# Patient Record
Sex: Male | Born: 1950 | Race: White | Hispanic: No | Marital: Single | Smoking: Heavy tobacco smoker
Health system: Southern US, Community
[De-identification: ages and names within clinical notes are randomized; demographics above are authoritative.]

## PROBLEM LIST (undated history)

## (undated) ENCOUNTER — Emergency Department (HOSPITAL_COMMUNITY): Admission: EM | Discharge status: Against Medical Advice | Payer: Self-pay

## (undated) DIAGNOSIS — K4090 Unilateral inguinal hernia, without obstruction or gangrene, not specified as recurrent: Secondary | ICD-10-CM

## (undated) DIAGNOSIS — I10 Essential (primary) hypertension: Secondary | ICD-10-CM

## (undated) DIAGNOSIS — I255 Ischemic cardiomyopathy: Secondary | ICD-10-CM

## (undated) DIAGNOSIS — E785 Hyperlipidemia, unspecified: Secondary | ICD-10-CM

## (undated) DIAGNOSIS — R9430 Abnormal result of cardiovascular function study, unspecified: Secondary | ICD-10-CM

## (undated) DIAGNOSIS — I2510 Atherosclerotic heart disease of native coronary artery without angina pectoris: Secondary | ICD-10-CM

## (undated) DIAGNOSIS — Z720 Tobacco use: Secondary | ICD-10-CM

## (undated) HISTORY — DX: Hyperlipidemia, unspecified: E78.5

## (undated) HISTORY — DX: Abnormal result of cardiovascular function study, unspecified: R94.30

## (undated) HISTORY — PX: CORONARY ANGIOPLASTY WITH STENT PLACEMENT: SHX49

## (undated) HISTORY — PX: OTHER SURGICAL HISTORY: SHX169

## (undated) HISTORY — DX: Atherosclerotic heart disease of native coronary artery without angina pectoris: I25.10

## (undated) HISTORY — DX: Ischemic cardiomyopathy: I25.5

---

## 1999-07-10 ENCOUNTER — Emergency Department (HOSPITAL_COMMUNITY): Admission: EM | Admit: 1999-07-10 | Discharge: 1999-07-10 | Payer: Self-pay | Admitting: Emergency Medicine

## 1999-07-10 ENCOUNTER — Encounter: Payer: Self-pay | Admitting: Emergency Medicine

## 2012-11-24 ENCOUNTER — Other Ambulatory Visit: Payer: Self-pay

## 2012-11-24 ENCOUNTER — Inpatient Hospital Stay (HOSPITAL_COMMUNITY)
Admission: EM | Admit: 2012-11-24 | Discharge: 2012-11-27 | DRG: 247 | Disposition: A | Discharge status: Home / Self Care | Payer: Medicaid Other | Source: Ambulatory Visit | Attending: Cardiovascular Disease | Admitting: Cardiovascular Disease

## 2012-11-24 ENCOUNTER — Encounter (HOSPITAL_COMMUNITY): Payer: Self-pay | Admitting: Nurse Practitioner

## 2012-11-24 ENCOUNTER — Encounter (HOSPITAL_COMMUNITY)
Admission: EM | Disposition: A | Discharge status: Home / Self Care | Payer: Self-pay | Source: Ambulatory Visit | Attending: Cardiovascular Disease

## 2012-11-24 DIAGNOSIS — I519 Heart disease, unspecified: Secondary | ICD-10-CM | POA: Diagnosis present

## 2012-11-24 DIAGNOSIS — I517 Cardiomegaly: Secondary | ICD-10-CM

## 2012-11-24 DIAGNOSIS — I2109 ST elevation (STEMI) myocardial infarction involving other coronary artery of anterior wall: Secondary | ICD-10-CM

## 2012-11-24 DIAGNOSIS — I255 Ischemic cardiomyopathy: Secondary | ICD-10-CM

## 2012-11-24 DIAGNOSIS — T783XXA Angioneurotic edema, initial encounter: Secondary | ICD-10-CM | POA: Diagnosis not present

## 2012-11-24 DIAGNOSIS — F17200 Nicotine dependence, unspecified, uncomplicated: Secondary | ICD-10-CM | POA: Diagnosis present

## 2012-11-24 DIAGNOSIS — I2510 Atherosclerotic heart disease of native coronary artery without angina pectoris: Secondary | ICD-10-CM | POA: Diagnosis present

## 2012-11-24 DIAGNOSIS — I2589 Other forms of chronic ischemic heart disease: Secondary | ICD-10-CM | POA: Diagnosis present

## 2012-11-24 DIAGNOSIS — I10 Essential (primary) hypertension: Secondary | ICD-10-CM | POA: Diagnosis present

## 2012-11-24 DIAGNOSIS — Z720 Tobacco use: Secondary | ICD-10-CM

## 2012-11-24 HISTORY — PX: PERCUTANEOUS CORONARY STENT INTERVENTION (PCI-S): SHX5485

## 2012-11-24 HISTORY — DX: Essential (primary) hypertension: I10

## 2012-11-24 HISTORY — DX: Tobacco use: Z72.0

## 2012-11-24 HISTORY — PX: LEFT HEART CATHETERIZATION WITH CORONARY ANGIOGRAM: SHX5451

## 2012-11-24 HISTORY — DX: Unilateral inguinal hernia, without obstruction or gangrene, not specified as recurrent: K40.90

## 2012-11-24 LAB — CBC WITH DIFFERENTIAL/PLATELET
Basophils Absolute: 0 10*3/uL (ref 0.0–0.1)
HCT: 42.4 % (ref 39.0–52.0)
Lymphocytes Relative: 17 % (ref 12–46)
Monocytes Absolute: 1 10*3/uL (ref 0.1–1.0)
Neutro Abs: 8.8 10*3/uL — ABNORMAL HIGH (ref 1.7–7.7)
Neutrophils Relative %: 75 % (ref 43–77)
RDW: 12.1 % (ref 11.5–15.5)
WBC: 11.9 10*3/uL — ABNORMAL HIGH (ref 4.0–10.5)

## 2012-11-24 LAB — CBC
MCH: 33.8 pg (ref 26.0–34.0)
MCHC: 36.8 g/dL — ABNORMAL HIGH (ref 30.0–36.0)
MCV: 91.7 fL (ref 78.0–100.0)
Platelets: 189 10*3/uL (ref 150–400)
RBC: 3.97 MIL/uL — ABNORMAL LOW (ref 4.22–5.81)
RDW: 12 % (ref 11.5–15.5)

## 2012-11-24 LAB — CK TOTAL AND CKMB (NOT AT ARMC): CK, MB: 7.5 ng/mL (ref 0.3–4.0)

## 2012-11-24 LAB — POCT I-STAT, CHEM 8
BUN: 11 mg/dL (ref 6–23)
Creatinine, Ser: 0.8 mg/dL (ref 0.50–1.35)
Glucose, Bld: 137 mg/dL — ABNORMAL HIGH (ref 70–99)
Hemoglobin: 13.6 g/dL (ref 13.0–17.0)
Potassium: 3.5 mEq/L (ref 3.5–5.1)
TCO2: 25 mmol/L (ref 0–100)

## 2012-11-24 LAB — LIPID PANEL
Cholesterol: 179 mg/dL (ref 0–200)
Total CHOL/HDL Ratio: 4.5 RATIO
Triglycerides: 117 mg/dL (ref <150)
VLDL: 23 mg/dL (ref 0–40)

## 2012-11-24 LAB — COMPREHENSIVE METABOLIC PANEL
ALT: 20 U/L (ref 0–53)
ALT: 26 U/L (ref 0–53)
AST: 27 U/L (ref 0–37)
AST: 32 U/L (ref 0–37)
Albumin: 3.5 g/dL (ref 3.5–5.2)
Alkaline Phosphatase: 104 U/L (ref 39–117)
BUN: 13 mg/dL (ref 6–23)
CO2: 22 mEq/L (ref 19–32)
Calcium: 8.2 mg/dL — ABNORMAL LOW (ref 8.4–10.5)
Chloride: 96 mEq/L (ref 96–112)
Creatinine, Ser: 0.75 mg/dL (ref 0.50–1.35)
GFR calc non Af Amer: >90 mL/min (ref >90)
Potassium: 4.5 mEq/L (ref 3.5–5.1)
Sodium: 128 mEq/L — ABNORMAL LOW (ref 135–145)
Sodium: 134 mEq/L — ABNORMAL LOW (ref 135–145)
Total Bilirubin: 0.7 mg/dL (ref 0.3–1.2)
Total Protein: 6.2 g/dL (ref 6.0–8.3)

## 2012-11-24 LAB — TSH: TSH: 1.376 u[IU]/mL (ref 0.350–4.500)

## 2012-11-24 LAB — TROPONIN I: Troponin I: 4.44 ng/mL (ref <0.30)

## 2012-11-24 LAB — MRSA PCR SCREENING: MRSA by PCR: POSITIVE — AB

## 2012-11-24 SURGERY — LEFT HEART CATHETERIZATION WITH CORONARY ANGIOGRAM
Anesthesia: LOCAL

## 2012-11-24 MED ORDER — MUPIROCIN 2 % EX OINT
1.0000 "application " | TOPICAL_OINTMENT | Freq: Two times a day (BID) | CUTANEOUS | Status: DC
  Administered 2012-11-24 – 2012-11-26 (×6): 1 via NASAL
  Filled 2012-11-24: qty 22

## 2012-11-24 MED ORDER — PERFLUTREN LIPID MICROSPHERE
1.0000 mL | INTRAVENOUS | Status: AC | PRN
  Filled 2012-11-24: qty 10

## 2012-11-24 MED ORDER — ACETAMINOPHEN 325 MG PO TABS: 650.0000 mg | ORAL_TABLET | ORAL | Status: DC | PRN

## 2012-11-24 MED ORDER — ASPIRIN EC 81 MG PO TBEC
81.0000 mg | DELAYED_RELEASE_TABLET | Freq: Every day | ORAL | Status: DC
  Administered 2012-11-25 – 2012-11-27 (×3): 81 mg via ORAL
  Filled 2012-11-24 (×3): qty 1

## 2012-11-24 MED ORDER — ATORVASTATIN CALCIUM 80 MG PO TABS
80.0000 mg | ORAL_TABLET | Freq: Every day | ORAL | Status: DC
  Administered 2012-11-24 – 2012-11-25 (×2): 80 mg via ORAL
  Filled 2012-11-24 (×3): qty 1

## 2012-11-24 MED ORDER — NITROGLYCERIN 0.4 MG SL SUBL: 0.4000 mg | SUBLINGUAL_TABLET | SUBLINGUAL | Status: DC | PRN

## 2012-11-24 MED ORDER — FUROSEMIDE 10 MG/ML IJ SOLN
INTRAMUSCULAR | Status: AC
  Filled 2012-11-24: qty 4

## 2012-11-24 MED ORDER — GUAIFENESIN 100 MG/5ML PO SOLN
10.0000 mL | ORAL | Status: DC | PRN
  Administered 2012-11-24 – 2012-11-25 (×2): 200 mg via ORAL
  Filled 2012-11-24 (×2): qty 10

## 2012-11-24 MED ORDER — SODIUM CHLORIDE 0.9 % IV SOLN
INTRAVENOUS | Status: AC
  Administered 2012-11-24 (×2): 75 mL/h via INTRAVENOUS

## 2012-11-24 MED ORDER — BIVALIRUDIN 250 MG IV SOLR
INTRAVENOUS | Status: AC
  Filled 2012-11-24: qty 250

## 2012-11-24 MED ORDER — SODIUM CHLORIDE 0.9 % IJ SOLN
3.0000 mL | Freq: Two times a day (BID) | INTRAMUSCULAR | Status: DC
  Administered 2012-11-24 – 2012-11-26 (×5): 3 mL via INTRAVENOUS

## 2012-11-24 MED ORDER — ZOLPIDEM TARTRATE 5 MG PO TABS: 5.0000 mg | ORAL_TABLET | Freq: Every evening | ORAL | Status: DC | PRN

## 2012-11-24 MED ORDER — METOPROLOL TARTRATE 25 MG PO TABS
25.0000 mg | ORAL_TABLET | Freq: Two times a day (BID) | ORAL | Status: DC
  Administered 2012-11-24 (×2): 25 mg via ORAL
  Filled 2012-11-24 (×4): qty 1

## 2012-11-24 MED ORDER — PRASUGREL HCL 10 MG PO TABS
10.0000 mg | ORAL_TABLET | Freq: Every day | ORAL | Status: DC
  Administered 2012-11-25 – 2012-11-27 (×3): 10 mg via ORAL
  Filled 2012-11-24 (×3): qty 1

## 2012-11-24 MED ORDER — SODIUM CHLORIDE 0.9 % IJ SOLN: 3.0000 mL | INTRAMUSCULAR | Status: DC | PRN

## 2012-11-24 MED ORDER — SODIUM CHLORIDE 0.9 % IV SOLN: 250.0000 mL | INTRAVENOUS | Status: DC | PRN

## 2012-11-24 MED ORDER — SODIUM CHLORIDE 0.9 % IV SOLN
0.2500 mg/kg/h | INTRAVENOUS | Status: DC
  Filled 2012-11-24: qty 250

## 2012-11-24 MED ORDER — GUAIFENESIN 100 MG/5ML PO SYRP
200.0000 mg | ORAL_SOLUTION | ORAL | Status: DC | PRN
  Filled 2012-11-24: qty 118

## 2012-11-24 MED ORDER — FENTANYL CITRATE 0.05 MG/ML IJ SOLN
INTRAMUSCULAR | Status: AC
  Filled 2012-11-24: qty 2

## 2012-11-24 MED ORDER — CHLORHEXIDINE GLUCONATE CLOTH 2 % EX PADS
6.0000 | MEDICATED_PAD | Freq: Every day | CUTANEOUS | Status: DC
  Administered 2012-11-25 – 2012-11-26 (×2): 6 via TOPICAL

## 2012-11-24 MED ORDER — ONDANSETRON HCL 4 MG/2ML IJ SOLN: 4.0000 mg | Freq: Four times a day (QID) | INTRAMUSCULAR | Status: DC | PRN

## 2012-11-24 MED ORDER — PERFLUTREN LIPID MICROSPHERE
INTRAVENOUS | Status: AC
  Administered 2012-11-24: 14:00:00
  Filled 2012-11-24: qty 10

## 2012-11-24 MED ORDER — PRASUGREL HCL 10 MG PO TABS
ORAL_TABLET | ORAL | Status: AC
  Filled 2012-11-24: qty 6

## 2012-11-24 MED ORDER — ALPRAZOLAM 0.25 MG PO TABS: 0.2500 mg | ORAL_TABLET | Freq: Two times a day (BID) | ORAL | Status: DC | PRN

## 2012-11-24 MED ORDER — MIDAZOLAM HCL 2 MG/2ML IJ SOLN
INTRAMUSCULAR | Status: AC
  Filled 2012-11-24: qty 2

## 2012-11-24 MED ORDER — METOPROLOL TARTRATE 1 MG/ML IV SOLN
INTRAVENOUS | Status: AC
  Filled 2012-11-24: qty 5

## 2012-11-24 NOTE — CV Procedure (Signed)
Cardiac Catheterization Operative Report  Kyle Finley [id number] 5/21/201410:40 AM No primary provider on file.  Procedure Performed:  1. Left Heart Catheterization 2. Selective Coronary Angiography 3. Left ventricular angiogram 4. PTCA/DES x 1 proximal LAD 5. Angioseal RFA  Operator: Verne Carrow, MD  Indication:  62 yo with history of tobacco abuse, HTN presented via EMS with anterior STEMI.       Non-system delay in intervention due to inability to pass a wire down the LAD.                              Procedure Details: Emergency consent obtained in the chart.  The patient concurred with the proposed plan, giving informed verbal consent. The patient was brought to the cath lab via EMS. The patient was sedated with Versed and Fentanyl. The right groin was prepped and draped in the usual manner. Using the modified Seldinger access technique, a 6 French sheath was placed in the right femoral artery. Standard diagnostic catheters were used to perform selective coronary angiography. A JR 4 catheter was used to engage the RCA. A XB LAD 3.5 guiding catheter was used to engage the left main artery.  The patient was found to have severe stenosis in the ostium of the LAD extending into the mid vessel with TIMI-2 flow down into the distal LAD.   PCI Note: The patient was given a bolus of Angiomax and a drip was started. He was given Effient 60 mg po x 1. I had considerable difficulty engaging a wire into the ostium of the LAD. Multiple wires were used. I eventually was able to engage a Fielder XT wire into the ostium of the LAD and the wire was advanced into the distal LAD. A 2.5 x 15 mm balloon was used to pre-dilate the stenosis back to the ostium. I then carefully positioned a 2.75 x 24 mm Promus Premier DES in the proximal LAD extending back to the ostium. None of the angiographic views showed the true ostium of the vessel. The stent was positioned back as far as I felt reasonable  without compromising the ostium of the Circumflex. The stent was deployed without difficulty. I then post-dilated the stent with a 3.0 x 15 mm [place] balloon x 2. There appeared to be an excellent angiographic resullt. There was good flow into the distal vessel.   A pigtail catheter was used to perform a left ventricular angiogram. There were no immediate complications. The patient was taken to the recovery area in stable condition.   Hemodynamic Findings: Central aortic pressure: 142/101 Left ventricular pressure: 136/20/21  Angiographic Findings:  Left main:  Distal 30% stenosis.   Left Anterior Descending Artery: 99% stenosis extending from the ostium into the mid vessel just before a septal perforator. The mid and distal vessel has diffuse 30% stenosis. The diagonal branch is moderate caliber with mild plaque.   Circumflex Artery: Large caliber vessel with 20% proximal stenosis. The first OM branch is small in caliber with mild plaque. The second OM branch is very small in caliber with 80% ostial stenosis. The third OM branch has mild plaque. The distal AV groove Circumflex has a 40-50% stenosis.   Right Coronary Artery: Large caliber, dominant vessel with 20% mid stenosis.   Left Ventricular Angiogram: LVEF 30% with severe hypokinesis of the anterior wall, apex and inferoapical walls.   Impression: 1. 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 Anterior  STEMI 3. Successful PTCA/DES x 1 proximal LAD 4. Moderate to severe LV systolic dysfunction  Recommendations: He will need dual anti-platelet therapy with ASA/Effient for at least one year. Will start statin, beta blocker. If BP tolerates, start Ace-inh in am. Echo in am. To CCU. Tobacco cessation.        Complications:  None. The patient tolerated the procedure well.

## 2012-11-24 NOTE — Care Management Note (Signed)
    Page 1 of 1   11/24/2012     2:21:30 PM   CARE MANAGEMENT NOTE 11/24/2012  Patient:  ZIDAN, HELGET   Account Number:  [id number]  Date Initiated:  11/24/2012  Documentation initiated by:  Junius Creamer  Subjective/Objective Assessment:   adm w mi     Action/Plan:   lives w fam   Anticipated DC Date:     Anticipated DC Plan:        DC Planning Services  CM consult  Medication Assistance      Choice offered to / List presented to:             Status of service:   Medicare Important Message given?   (If response is "NO", the following Medicare IM given date fields will be blank) Date Medicare IM given:   Date Additional Medicare IM given:    Discharge Disposition:    Per UR Regulation:  Reviewed for med. necessity/level of care/duration of stay  If discussed at Long Length of Stay Meetings, dates discussed:    Comments:  5/21 1418 debbie Levorn Oleski rn,bsn gave pt effient 30day free and copay assist card. no ins for meds. left effient pt assist form on chart for md to sign.

## 2012-11-24 NOTE — Progress Notes (Signed)
Echocardiogram 2D Echocardiogram with Definity has been performed.  Kyle Finley 11/24/2012, 2:48 PM

## 2012-11-24 NOTE — Progress Notes (Signed)
Chaplain Note:  Chaplain responded to Code STEMI page received at 08:48.  When chaplain arrived, pt was in cath lab 6 being treated by cath lab staff.  No family was present.  Chaplain checked for family's arrival throughout the procedure but at the end of the procedure, no one had yet arrived.  Chaplain provided spiritual comfort and support for cath lab staff who expressed appreciation for chaplain support.  Chaplain will follow up as needed.  11/24/12 1100  Clinical Encounter Type  Visited With Patient;Health care provider  Visit Type Spiritual support  Referral From Other (Comment) (Code Stemi page)  Spiritual Encounters  Spiritual Needs Emotional  Stress Factors  Patient Stress Factors Health changes  Family Stress Factors None identified (No family present)  Verdie Shire, chaplain [phone]

## 2012-11-24 NOTE — H&P (Signed)
Patient ID: Kyle Finley MRN: [id number], DOB/AGE: Feb 23, 1951   Admit date: 11/24/2012  Primary Physician: None Primary Cardiologist: New - seen by C. Roddy Bellamy, MD (pt lives in [place])  Pt. Profile:  62 y/o male without prior cardiac hx who presents with acute anterior STEMI.  Problem List  Past Medical History  Diagnosis Date  . HTN (hypertension)   . Tobacco abuse     a. 1.5 ppd x 40 yrs  . Inguinal hernia     a. s/p repair x 2 on left    Past Surgical History  Procedure Laterality Date  . Hernia repair      a. L inguinal x 2     Allergies  No Known Allergies  HPI  62 y/o male w/o prior cardiac hx.  He has a h/o untreated HTN, reportedly off of meds x at least 5 years.  He has also been smoking 1.5 ppd of cigarettes since his 20's.  His father and 2 brothers died with MI's in their 28's  He was in his usoh until 6 days ago when he began to experience substernal chest discomfort, usually associated with eating breakfast and drinking coffee, while at rest, sometimes associated with diaphoresis, usually lasting about 15 mins and resolving spontaneously.  He has had multiple episodes over the past 6 days but never with exertion.  This AM, while smoking a cigarette and drinking coffee, at approximately 8:30 AM, he developed recurrent chest pain associated with diaphoresis.  This was worse than previous episodes and he called 911.  Upon arrival, he was noted to have anterior ST elevation and he was treated with ASA and ntg with complete relief of Ss and some resolution of ST elevation.  Code STEMI was activated and he was taken to the [facility] cath lab for further evaluation.  Upon arrival, he is pain free but with persistent anterior ST elevation.  Home Medications  Prior to Admission medications   None   Family History  Family History  Problem Relation Age of Onset  . Diabetes Mother     alive @ 47  . Heart attack Father     died @ 35  . Heart attack Brother     died  @ 46  . Heart attack Brother     died @ 90  . Cancer Sister     died @ 74   Social History  History   Social History  . Marital Status: Legally Separated    Spouse Name: N/A    Number of Children: N/A  . Years of Education: N/A   Occupational History  . Not on file.   Social History Main Topics  . Smoking status: Heavy Tobacco Smoker -- 1.50 packs/day for 40 years  . Smokeless tobacco: Not on file  . Alcohol Use: No  . Drug Use: No  . Sexually Active: Not on file   Other Topics Concern  . Not on file   Social History Narrative   Lives in [place] with his mother.  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    Review of Systems General:  No chills, fever, night sweats or weight changes.  Cardiovascular:  +++ chest pain and diaphoresis.  No dyspnea on exertion, edema, orthopnea, palpitations, paroxysmal nocturnal dyspnea. Dermatological: No rash, lesions/masses Respiratory: No cough, dyspnea Urologic: No hematuria, dysuria Abdominal:   No nausea, vomiting, diarrhea, bright red blood per rectum, melena, or hematemesis Neurologic:  No  visual changes, wkns, changes in mental status. All other systems reviewed and are otherwise negative except as noted above.  Physical Exam  There were no vitals taken for this visit.  General: Pleasant, NAD Psych: Normal affect. Neuro: Alert and oriented X 3. Moves all extremities spontaneously. HEENT: Normal  Neck: Supple without bruits or JVD. Lungs:  Resp regular and unlabored, diminished breath sounds bilat. Heart: RRR no s3, s4, or murmurs. Abdomen: Soft, non-tender, non-distended, BS + x 4.  Extremities: No clubbing, cyanosis or edema. DP/PT/Radials 2+ and equal bilaterally.  Labs  All labs pending  Radiology/Studies  No results found.  ECG  ST, 122, ivcd, 2-3 mm ST elevation in I, V1-V4, twi aVL.  ASSESSMENT AND PLAN  1.  Acute anterior STEMI:  Intermittent pain since last week.   Worse this AM.  Currently pain free after receiving ntg from EMS.  Persistent anterior ST elevation.  Emergent cath ongoing.  Plan asa, high potency statin, bb (as pressure allows), and P2Y12 inhibitor provided he does not have surgical dzs.    2.  HTN:  Add bb +/- acei as tolerated.  3.  Lipids:  Unknown.  Check lipids/lft's.  Adding high potency statin.  4.  Tob Abuse:  Will require cessation counseling.   Signed, Nicolasa Ducking, NP 11/24/2012, 9:22 AM  I have personally seen and examined this patient with Ward Givens, NP. I agree with the assessment and plan as outlined above. Anterior STEMI. Emergent cath with possible PCI.   Kyle Finley 12:30 PM 11/24/2012

## 2012-11-24 NOTE — Progress Notes (Addendum)
Dr Clifton James aware CE will be positive, no need to call unless pt symptomatic, per conversation

## 2012-11-25 DIAGNOSIS — F17200 Nicotine dependence, unspecified, uncomplicated: Secondary | ICD-10-CM

## 2012-11-25 DIAGNOSIS — I2109 ST elevation (STEMI) myocardial infarction involving other coronary artery of anterior wall: Secondary | ICD-10-CM

## 2012-11-25 DIAGNOSIS — Z720 Tobacco use: Secondary | ICD-10-CM | POA: Diagnosis present

## 2012-11-25 DIAGNOSIS — I10 Essential (primary) hypertension: Secondary | ICD-10-CM

## 2012-11-25 LAB — CBC
HCT: 39.1 % (ref 39.0–52.0)
Hemoglobin: 14.1 g/dL (ref 13.0–17.0)
MCH: 33 pg (ref 26.0–34.0)
MCHC: 36.1 g/dL — ABNORMAL HIGH (ref 30.0–36.0)
MCV: 91.6 fL (ref 78.0–100.0)
RBC: 4.27 MIL/uL (ref 4.22–5.81)

## 2012-11-25 LAB — BASIC METABOLIC PANEL
BUN: 18 mg/dL (ref 6–23)
CO2: 25 mEq/L (ref 19–32)
Calcium: 9 mg/dL (ref 8.4–10.5)
GFR calc non Af Amer: >90 mL/min (ref >90)
Glucose, Bld: 116 mg/dL — ABNORMAL HIGH (ref 70–99)

## 2012-11-25 LAB — LIPID PANEL: Cholesterol: 198 mg/dL (ref 0–200)

## 2012-11-25 LAB — TROPONIN I
Troponin I: 4.91 ng/mL (ref <0.30)
Troponin I: 4.89 ng/mL (ref <0.30)
Troponin I: 3.98 ng/mL (ref <0.30)

## 2012-11-25 LAB — HEMOGLOBIN A1C: Mean Plasma Glucose: 105 mg/dL (ref <117)

## 2012-11-25 MED ORDER — METOPROLOL TARTRATE 50 MG PO TABS
50.0000 mg | ORAL_TABLET | Freq: Two times a day (BID) | ORAL | Status: DC
  Administered 2012-11-25 – 2012-11-27 (×5): 50 mg via ORAL
  Filled 2012-11-25 (×6): qty 1

## 2012-11-25 MED ORDER — MENTHOL 3 MG MT LOZG
1.0000 | LOZENGE | OROMUCOSAL | Status: DC | PRN
  Administered 2012-11-25 (×2): 3 mg via ORAL
  Filled 2012-11-25: qty 9

## 2012-11-25 MED ORDER — CEPASTAT 14.5 MG MT LOZG
1.0000 | LOZENGE | OROMUCOSAL | Status: DC | PRN
  Filled 2012-11-25: qty 18

## 2012-11-25 MED FILL — Sodium Chloride IV Soln 0.9%: INTRAVENOUS | Qty: 50 | Status: AC

## 2012-11-25 MED FILL — Lidocaine HCl Local Preservative Free (PF) Inj 1%: INTRAMUSCULAR | Qty: 30 | Status: AC

## 2012-11-25 MED FILL — Heparin Sodium (Porcine) 2 Unit/ML in Sodium Chloride 0.9%: INTRAMUSCULAR | Qty: 1000 | Status: AC

## 2012-11-25 NOTE — Progress Notes (Signed)
CARDIAC REHAB PHASE I   PRE:  Rate/Rhythm: 93 SR    BP: sitting 104/70    SaO2:   MODE:  Ambulation: 500 ft   POST:  Rate/Rhythm: 102    BP: sitting 120/80     SaO2:   Tolerated well, no c/o. Began ed. Pt sts he doesn't like medicine. Encouraged pt to think about his goals in life. Planning to attempt to quit smoking. Will continue ed tomorrow. [phone]   Elissa Lovett [place] CES, ACSM 11/25/2012 3:25 PM

## 2012-11-25 NOTE — Progress Notes (Signed)
Subjective:  The patient was admitted yesterday with acute anterior STEMI, s/p DES to LAD.  Overnight, the patient notes no chest pain, SOB, LH, dizziness, nausea, or diaphoresis.  No bleeding from cath site.  Troponins peaked at 7.94, and have now downtrended to 4.91.  Objective:  Vital Signs in the last 24 hours: Temp:  [98 F (36.7 C)-99.5 F (37.5 C)] 98 F (36.7 C) (05/22 0400) Pulse Rate:  [92-109] 95 (05/22 0630) Resp:  [16-28] 21 (05/22 0630) BP: (87-151)/(58-117) 108/74 mmHg (05/22 0630) SpO2:  [89 %-99 %] 93 % (05/22 0630) Weight:  [169 lb 1.5 oz (76.7 kg)-174 lb 2.6 oz (79 kg)] 169 lb 1.5 oz (76.7 kg) (05/22 0500)  Intake/Output from previous day: 05/21 0701 - 05/22 0700 In: 975 [P.O.:600; I.V.:375] Out: 2300 [Urine:2300]  Physical Exam: General: alert, cooperative, and in no apparent distress HEENT: pupils equal round and reactive to light, vision grossly intact, oropharynx clear and non-erythematous  Neck: supple, no lymphadenopathy Lungs: clear to ascultation bilaterally, normal work of respiration, no wheezes, rales, ronchi Heart: regular rate and rhythm, no murmurs, gallops, or rubs Abdomen: soft, non-tender, non-distended, normal bowel sounds.  Right groin bandaged, without oozing. Extremities: no cyanosis, clubbing, or edema Neurologic: alert & oriented X3, cranial nerves II-XII intact, strength grossly intact, sensation intact to light touch  Lab Results:  Recent Labs  11/24/12 1125 11/25/12 0510  WBC 11.9* PENDING  HGB 15.3 14.1  PLT 225 PENDING    Recent Labs  11/24/12 1125 11/25/12 0510  NA 134* 136  K 4.5 4.0  CL 96 98  CO2 25 25  GLUCOSE 126* 116*  BUN 13 18  CREATININE 0.85 0.85    Recent Labs  11/24/12 2237 11/25/12 0510  TROPONINI 7.55* 4.91*    Cardiac Studies: Cardiac Cath 11/24/12 Central aortic pressure: 142/101  Left ventricular pressure: 136/20/21  Angiographic Findings:  Left main: Distal 30% stenosis.  Left  Anterior Descending Artery: 99% stenosis extending from the ostium into the mid vessel just before a septal perforator. The mid and distal vessel has diffuse 30% stenosis. The diagonal branch is moderate caliber with mild plaque.  Circumflex Artery: Large caliber vessel with 20% proximal stenosis. The first OM branch is small in caliber with mild plaque. The second OM branch is very small in caliber with 80% ostial stenosis. The third OM branch has mild plaque. The distal AV groove Circumflex has a 40-50% stenosis.  Right Coronary Artery: Large caliber, dominant vessel with 20% mid stenosis.  Left Ventricular Angiogram: LVEF 30% with severe hypokinesis of the anterior wall, apex and inferoapical walls.  Impression:  1. 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 Anterior STEMI  3. Successful PTCA/DES x 1 proximal LAD  4. Moderate to severe LV systolic dysfunction  Recommendations: He will need dual anti-platelet therapy with ASA/Effient for at least one year. Will start statin, beta blocker. If BP tolerates, start Ace-inh in am. Echo in am. To CCU. Tobacco cessation.   Echocardiogram 11/24/12 - Left ventricle: Akinesis entire apex, base/mid anterior, base/mid anteroseptal, mid inferoseptal, mid inferior. Contrast was used. There is no definite clot. The cavity size was normal. Wall thickness was increased in a pattern of moderate LVH. The estimated ejection fraction was 20%. Findings consistent with left ventricular diastolic dysfunction. - Right ventricle: Systolic function was mildly reduced.  Tele: NSR  Assessment/Plan:  The patient is a 62 yo man, history of tobacco abuse, HTN, FH of MI, presenting with chest  pain, found to have occlusion of LAD, s/p DES x1.  # STEMI - the patient presented with anterior STEMI, found to have occlusion of LAD, s/p DES x1.  The patient notes no chest pain overnight. -continue aspirin, effient -continue atorvastatin -increase metoprolol to 50  mg BID  # HTN - off medications x5 years.  BP's now mostly in 110/70's. -continue metoprolol -consider ACE-I as BP tolerates, consider starting tomorrow  # Reduced EF - Echo shows an EF of 20%, with significant areas of akinesis.  Hopefully this just represents stunning of the myocardium after STEMI.  Alternatively, this may represent CHF, due to ischemic cardiomyopathy. -continue metoprolol -ACE-I once BP allows -repeat echo in the outpatient setting  # Tobacco abuse -encouraged cessation  # Dispo - recovering well, transfer to telemetry  Janalyn Harder, M.D. 11/25/2012, 6:52 AM  Attending Note:   The patient was seen and examined.  Agree with assessment and plan as noted above.  Changes made to the above note as needed.  Mr. Shanholtzer is doing well.  Will transfer him to tele.  Continue other meds.  He needs to stop smoking.   Aggressive lipid lowering therapy.   Vesta Mixer, Montez Hageman., MD, [facility] 11/25/2012, 9:10 AM

## 2012-11-26 DIAGNOSIS — I2589 Other forms of chronic ischemic heart disease: Secondary | ICD-10-CM

## 2012-11-26 DIAGNOSIS — T783XXA Angioneurotic edema, initial encounter: Secondary | ICD-10-CM

## 2012-11-26 LAB — COMPREHENSIVE METABOLIC PANEL
Alkaline Phosphatase: 102 U/L (ref 39–117)
BUN: 16 mg/dL (ref 6–23)
CO2: 25 mEq/L (ref 19–32)
Chloride: 99 mEq/L (ref 96–112)
GFR calc Af Amer: >90 mL/min (ref >90)
GFR calc non Af Amer: >90 mL/min (ref >90)
Glucose, Bld: 121 mg/dL — ABNORMAL HIGH (ref 70–99)
Potassium: 3.7 mEq/L (ref 3.5–5.1)
Total Bilirubin: 0.6 mg/dL (ref 0.3–1.2)

## 2012-11-26 LAB — CBC
HCT: 40.7 % (ref 39.0–52.0)
Hemoglobin: 14.1 g/dL (ref 13.0–17.0)
MCV: 93.1 fL (ref 78.0–100.0)
WBC: 9.5 10*3/uL (ref 4.0–10.5)

## 2012-11-26 LAB — TROPONIN I: Troponin I: 4.39 ng/mL (ref <0.30)

## 2012-11-26 LAB — C-REACTIVE PROTEIN: CRP: 5.6 mg/dL — ABNORMAL HIGH (ref <0.60)

## 2012-11-26 MED ORDER — EPINEPHRINE HCL 0.1 MG/ML IJ SOSY
0.5000 mg | PREFILLED_SYRINGE | Freq: Once | INTRAMUSCULAR | Status: AC
  Administered 2012-11-26: 0.5 mg via INTRAVENOUS
  Filled 2012-11-26: qty 10

## 2012-11-26 NOTE — Progress Notes (Signed)
MEDICATION RELATED CONSULT NOTE - INITIAL   Pharmacy Consult for medications that can cause angioedema   No Known Allergies  Vital Signs: Temp: 98.8 F (37.1 C) (05/23 0436) Temp src: Oral (05/23 0436) BP: 124/84 mmHg (05/23 0436) Pulse Rate: 89 (05/23 0436)  Medical History: Past Medical History  Diagnosis Date  . HTN (hypertension)   . Tobacco abuse     a. 1.5 ppd x 40 yrs  . Inguinal hernia     a. s/p repair x 2 on left    Medications:  Scheduled:  . aspirin EC  81 mg Oral Daily  . Chlorhexidine Gluconate Cloth  6 each Topical Q0600  . EPINEPHrine  0.5 mg Intravenous Once  . metoprolol tartrate  50 mg Oral BID  . mupirocin ointment  1 application Nasal BID  . prasugrel  10 mg Oral Daily  . sodium chloride  3 mL Intravenous Q12H    Assessment: 62 yo man with angioedema reported this am.  Medications were reviewed and agree with MD's assessment that statin most likely cause.  Please advise if we can be of further assistance.   Thanks for allowing pharmacy to be a part of this patient's care.  Talbert Cage, PharmD Clinical Pharmacist, [phone]

## 2012-11-26 NOTE — Progress Notes (Signed)
Pt's bottom lip noted swollen this morning, MD on-call notified. He came in and assessed the Pt and no new order received. No distress indicated. Denies any discomfort at this time. Will cont to monitor.

## 2012-11-26 NOTE — Progress Notes (Signed)
Epinephrine 0.5mg  given for Swelling of lower lip per Md ordered. Currently resting in bed with eyes closed. No indications of distress noted. Will continue to monitor.

## 2012-11-26 NOTE — Progress Notes (Signed)
    SUBJECTIVE: No chest pain or SOB. Lip swelling is better. Tongue feels normal.   BP 124/84  Pulse 89  Temp(Src) 98.8 F (37.1 C) (Oral)  Resp 18  Wt 170 lb 3.1 oz (77.2 kg)  SpO2 94%  Intake/Output Summary (Last 24 hours) at 11/26/12 0703 Last data filed at 11/25/12 2300  Gross per 24 hour  Intake    903 ml  Output    400 ml  Net    503 ml    PHYSICAL EXAM General: Well developed, well nourished, in no acute distress. Alert and oriented x 3.  Psych:  Good affect, responds appropriately Neck: No JVD. No masses noted.  Lungs: Poor air movement bilaterally with no wheezes or rhonci noted.  Heart: RRR with no murmurs noted. Abdomen: Bowel sounds are present. Soft, non-tender.  Extremities: No lower extremity edema.   LABS: Basic Metabolic Panel:  Recent Labs  16/10/96 0510 11/26/12 0500  NA 136 136  K 4.0 3.7  CL 98 99  CO2 25 25  GLUCOSE 116* 121*  BUN 18 16  CREATININE 0.85 0.84  CALCIUM 9.0 9.1   CBC:  Recent Labs  11/24/12 1010 11/24/12 1125 11/25/12 0510 11/26/12 0500  WBC 11.1* 11.9* 11.2* 9.5  NEUTROABS  --  8.8*  --   --   HGB 13.4 15.3 14.1 14.1  HCT 36.4* 42.4 39.1 40.7  MCV 91.7 92.4 91.6 93.1  PLT 189 225 PLATELET CLUMPS NOTED ON SMEAR, COUNT APPEARS ADEQUATE 229   Cardiac Enzymes:  Recent Labs  11/24/12 1010  11/25/12 1230 11/25/12 1658 11/25/12 2329  CKTOTAL 218  --   --   --   --   CKMB 7.5*  --   --   --   --   TROPONINI 3.50*  < > 4.89* 3.98* 4.39*  < > = values in this interval not displayed. Fasting Lipid Panel:  Recent Labs  11/25/12 0510  CHOL 198  HDL 41  LDLCALC 128*  TRIG 144  CHOLHDL 4.8    Current Meds: . aspirin EC  81 mg Oral Daily  . Chlorhexidine Gluconate Cloth  6 each Topical Q0600  . metoprolol tartrate  50 mg Oral BID  . mupirocin ointment  1 application Nasal BID  . prasugrel  10 mg Oral Daily  . sodium chloride  3 mL Intravenous Q12H     ASSESSMENT AND PLAN: 62 yo male with history of  tobacco abuse, HTN admitted with anterior STEMI on 11/24/12. Now s/p DES x 1 proximal LAD.   1. CAD/STEMI:  the patient presented with anterior STEMI, found to have occlusion of LAD, s/p DES x1. The patient notes no chest pain overnight. Will continue aspirin, effient for one year. He was on a statin but stopped last night with lower lip swelling concering for allergic reaction. Continue metoprolol 50 mg BID. Will not start Ace-inh currently in setting of possible angioedema.   2. HTN: Stable on current meds.   3. Ischemic cardiomyopathy:  Echo shows an EF of 20%, with significant areas of akinesis. Continue beta blocker. No Ace-inh initiation in setting of angioedema. Repeat echo in the outpatient setting   4. Tobacco abuse: Cessation encouraged.    5. Angioedema: Lower lip involvement this AM. Given epinephrine. Statin held. He has tolerated ASA in the past. Will follow closely. No airway involvement.     Quantina Dershem  5/23/20147:03 AM

## 2012-11-26 NOTE — Progress Notes (Signed)
CARDIAC REHAB PHASE I   PRE:  Rate/Rhythm: 87 SR   MODE:  Ambulation: 500 ft   POST:  Rate/Rhythm: 99 SR   Pt walking independently. Offered to go on another walk with me. Feels good.  Ed completed. Asked appropriate questions. Going to try smoking cessation.  Not open to NRT. Pt sts he is not sure he can quit but will try. [facility] closing in June and other programs are too far to drive. Will ex on his own. [phone]  Harriet Masson CES, ACSM 11/26/2012 11:55 AM

## 2012-11-26 NOTE — Progress Notes (Addendum)
Cross-Cover Called by nursing due to lip swelling.   Pt with lower lip angio-edema. No urticaria or rashes. Tongue is not involved. No throat swelling or difficulty breathing. Onset around 4 am. Had some grapefruit juice at 10 pm. Meds include aspirin (has taken before without problem), atorvastatin, metoprolol, prasugrel; all given yesterday 8+ hrs ago.  Web search indicates that of these medications, aspirin or the statin is perhaps the most likely culprits. Pt reports taking aspirin in the past without difficultly. Discontinued atorvastatin.  Pharmacy consult entered to look for other potential medications.  Self limited to lip, no evidence of impending respiratory compromise; continue close monitoring. 0.5 mg epinephrine to bedside as a precaution.  Addendum: In reviewing pt's course via remote Epic, noted that bedside epinephrine was given IV. First, this was not the order as it should have been given IM. Second, it was only if needed ("to bedside") in the setting of anaphylactic emergency and the MD was to be contacted prior to administration.

## 2012-11-27 ENCOUNTER — Encounter (HOSPITAL_COMMUNITY): Payer: Self-pay

## 2012-11-27 MED ORDER — NITROGLYCERIN 0.4 MG SL SUBL: 0.4000 mg | SUBLINGUAL_TABLET | SUBLINGUAL | Status: DC | PRN

## 2012-11-27 MED ORDER — METOPROLOL TARTRATE 50 MG PO TABS: 50.0000 mg | ORAL_TABLET | Freq: Two times a day (BID) | ORAL | Status: DC

## 2012-11-27 MED ORDER — LISINOPRIL 2.5 MG PO TABS: 2.5000 mg | ORAL_TABLET | Freq: Two times a day (BID) | ORAL | Status: DC

## 2012-11-27 MED ORDER — PRASUGREL HCL 10 MG PO TABS: 10.0000 mg | ORAL_TABLET | Freq: Every day | ORAL | Status: DC

## 2012-11-27 MED ORDER — LISINOPRIL 2.5 MG PO TABS
2.5000 mg | ORAL_TABLET | Freq: Two times a day (BID) | ORAL | Status: DC
  Filled 2012-11-27 (×2): qty 1

## 2012-11-27 MED ORDER — ASPIRIN 81 MG PO TBEC: 81.0000 mg | DELAYED_RELEASE_TABLET | Freq: Every day | ORAL | Status: AC

## 2012-11-27 NOTE — Progress Notes (Addendum)
Kyle Finley  62 y.o.  male  Subjective: Feels fine; walked in the hall without difficulty. Has not experienced dyspnea. 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  Allergy: Lipitor  Objective: Vital signs in last 24 hours: Temp:  [98.3 F (36.8 C)-98.9 F (37.2 C)] 98.3 F (36.8 C) (05/24 0559) Pulse Rate:  [80-90] 80 (05/24 0559) Resp:  [18] 18 (05/24 0559) BP: (104-121)/(69-80) 121/76 mmHg (05/24 0559) SpO2:  [94 %-98 %] 98 % (05/24 0559) Weight:  [76.5 kg (168 lb 10.4 oz)-76.567 kg (168 lb 12.8 oz)] 76.5 kg (168 lb 10.4 oz) (05/24 0812)  76.5 kg (168 lb 10.4 oz) There is no height on file to calculate BMI.  Weight change: -0.633 kg (-1 lb 6.3 oz) Last BM Date: 11/26/12  Intake/Output from previous day: 05/23 0701 - 05/24 0700 In: 480 [P.O.:480] Out: -  Total I/O since admission:  -200 cc  General- Well developed; no acute distress; hoarse voice Neck- No JVD Lungs- clear lung fields; some prolongation of the expiratory phase Cardiovascular- normal PMI; normal S1 and S2; no third heart sound Abdomen- normal bowel sounds; soft and non-tender without masses or organomegaly Skin- Warm, no significant lesions Extremities- Nl distal pulses; no edema  Lab Results: Cardiac Markers:   Recent Labs  11/25/12 1658 11/25/12 2329  TROPONINI 3.98* 4.39*   CBC:   Recent Labs  11/25/12 0510 11/26/12 0500  WBC 11.2* 9.5  HGB 14.1 14.1  HCT 39.1 40.7  PLT PLATELET CLUMPS NOTED ON SMEAR, COUNT APPEARS ADEQUATE 229   BMET:  Recent Labs  11/25/12 0510 11/26/12 0500  NA 136 136  K 4.0 3.7  CL 98 99  CO2 25 25  GLUCOSE 116* 121*  BUN 18 16  CREATININE 0.85 0.84  CALCIUM 9.0 9.1   Hepatic Function:   Recent Labs  11/26/12 0500  PROT 7.0  ALBUMIN 3.1*  AST 20  ALT 23  ALKPHOS 102  BILITOT 0.6   GFR:  CrCl is unknown because there is no height on file for the current visit. Lipids:  Lipid Panel     Component Value  Date/Time   CHOL 198 11/25/2012 0510   TRIG 144 11/25/2012 0510   HDL 41 11/25/2012 0510   CHOLHDL 4.8 11/25/2012 0510   VLDL 29 11/25/2012 0510   LDLCALC 128* 11/25/2012 0510   EKG: 11/26/12  Normal sinus rhythm;  anterior myocardial infarction in evolution; preserved R-wave voltage in leads V5-V6.  Imaging Studies/Results: No results found.  Medications:  I have reviewed the patient's current medications. Scheduled: . aspirin EC  81 mg Oral Daily  . Chlorhexidine Gluconate Cloth  6 each Topical Q0600  . metoprolol tartrate  50 mg Oral BID  . mupirocin ointment  1 application Nasal BID  . prasugrel  10 mg Oral Daily  . sodium chloride  3 mL Intravenous Q12H    Assessment/Plan: Acute myocardial infarction: Uncomplicated course to date. Severe left ventricular dysfunction at presentation raises concern about the possibility of developing congestive heart failure, arrhythmias or left ventricular thrombus. Repeat echocardiogram will be obtained in approximately 2 weeks to assess for possible recovery of myocardial contractility. Medications will require dose adjustment after discharge. Early appointment will be needed in 1-2 weeks at either the [place] office or in [place].  Importance of uninterrupted administration of Prasugrel stressed.  Referral to cardiac rehabilitation discussed-patient does not wish to commit at this time.  Treatment with eplerenone or  spironolactone can be considered, but will be superfluous if LV function recovers. Similarly, the patient is currently a candidate for AICD implantation, but hopefully will not be if you EF improves.  Hypertension: Normal blood pressures in hospital. Cardiac medications will likely adequately controlled what appears to be mildly elevated blood pressure. Low-dose ACE inhibitor will be added to beta blocker.  Tobacco abuse: Importance of discontinuing cigarette smoking explained, but patient does not appear to be motivated at present.   LOS: 3  days   [place] Bing 11/27/2012, 11:04 AM 20 minutes spent with patient

## 2012-11-27 NOTE — Discharge Summary (Signed)
Physician Discharge Summary  Patient ID: Kyle Finley MRN: [id number] DOB/AGE: 64/25/1952 64 y.o.  Admit date: 11/24/2012 Discharge date: 11/27/2012  Primary Discharge Diagnosis:  1.Acute Myocardial Infarction of the proximal LAD 2. Systolic Dysfunction  Secondary Discharge Diagnosis: 1. Hypertension 2, Tobacco Abuse  Significant Diagnostic Studies:  1. Cardiac Catheterization 11/24/2012  Angiographic Findings:  Left main: Distal 30% stenosis.  Left Anterior Descending Artery: 99% stenosis extending from the ostium into the mid vessel just before a septal perforator. The mid and distal vessel has diffuse 30% stenosis. The diagonal branch is moderate caliber with mild plaque.  Circumflex Artery: Large caliber vessel with 20% proximal stenosis. The first OM branch is small in caliber with mild plaque. The second OM branch is very small in caliber with 80% ostial stenosis. The third OM branch has mild plaque. The distal AV groove Circumflex has a 40-50% stenosis.  Right Coronary Artery: Large caliber, dominant vessel with 20% mid stenosis.  Left Ventricular Angiogram: LVEF 30% with severe hypokinesis of the anterior wall, apex and inferoapical walls.  Impression:  1. 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 Anterior STEMI  3. Successful PTCA/DES x 1 proximal LAD  4. Moderate to severe LV systolic dysfunction  Hospital Course: Kyle Finley is a 62 year old male patient admitted in the setting of ST elevation MI on 5 21 2014. The patient had sudden onset of chest pain with associated diaphoresis while smoking a cigarette and drinking coffee earlier that morning. The patient was brought emergently to cardiac catheterization lab where Dr. Clifton James performed coronary angiography. This demonstrated severe stenosis at the ostium of the LAD of 99% extending into the mid vessel. The patient was treated with a Promus Premer DES. The patient was also noted to have a very small second OM  branch with 80% ostial stenosis. LVEF per cardiac catheterization was 30% with severe hypokinesis of the anterior wall, apex and inferior apical walls.  The patient was placed on dual antiplatelet therapy with aspirin and Effient. Statin,ACE inhibitor and beta blocker were also initiated. He was given tobacco cessation instructions. He worked with cardiac rehabilitation during hospitalization as well. No recurrence of chest discomfort. He had no evidence of bleeding.  On day of discharge, he was seen and examined by Dr. Cot[place] Bing. It is recommended the patient have a two-week followup, with echocardiogram completed. He will be followed closely, and monitored for response to medication therapy. Consideration for ICD pacemaker should LV function not recover. The patient wishes to followup in the [place] office, as that is where he lives. Appointment will be made for that office with Dr. Deshler Lions.  Discharge Exam: Blood pressure 121/76, pulse 80, temperature 98.3 F (36.8 C), temperature source Oral, resp. rate 18, weight 168 lb 10.4 oz (76.5 kg), SpO2 98.00%. Labs:   Lab Results  Component Value Date   WBC 9.5 11/26/2012   HGB 14.1 11/26/2012   HCT 40.7 11/26/2012   MCV 93.1 11/26/2012   PLT 229 11/26/2012    Recent Labs Lab 11/26/12 0500  NA 136  K 3.7  CL 99  CO2 25  BUN 16  CREATININE 0.84  CALCIUM 9.1  PROT 7.0  BILITOT 0.6  ALKPHOS 102  ALT 23  AST 20  GLUCOSE 121*   Lab Results  Component Value Date   CKTOTAL 218 11/24/2012   CKMB 7.5* 11/24/2012   TROPONINI 4.39* 11/25/2012    Lab Results  Component Value Date   CHOL 198 11/25/2012   CHOL  179 11/24/2012   Lab Results  Component Value Date   HDL 41 11/25/2012   HDL 40 1/91/4782   Lab Results  Component Value Date   LDLCALC 128* 11/25/2012   LDLCALC 116* 11/24/2012   Lab Results  Component Value Date   TRIG 144 11/25/2012   TRIG 117 11/24/2012   Lab Results  Component Value Date   CHOLHDL 4.8 11/25/2012    CHOLHDL 4.5 11/24/2012   No results found for this basename: LDLDIRECT    NFA:OZHYQM sinus rhythm rate of 87 bpm Left axis deviation Incomplete right bundle branch block  FOLLOW UP PLANS AND APPOINTMENTS Discharge Orders   Future Orders Complete By Expires     Diet - low sodium heart healthy  As directed     Increase activity slowly  As directed         Medication List    TAKE these medications       aspirin 81 MG EC tablet  Take 1 tablet (81 mg total) by mouth daily.     lisinopril 2.5 MG tablet  Commonly known as:  PRINIVIL,ZESTRIL  Take 1 tablet (2.5 mg total) by mouth 2 (two) times daily.     metoprolol 50 MG tablet  Commonly known as:  LOPRESSOR  Take 1 tablet (50 mg total) by mouth 2 (two) times daily.     nitroGLYCERIN 0.4 MG SL tablet  Commonly known as:  NITROSTAT  Place 1 tablet (0.4 mg total) under the tongue every 5 (five) minutes x 3 doses as needed for chest pain.     prasugrel 10 MG Tabs  Commonly known as:  EFFIENT  Take 1 tablet (10 mg total) by mouth daily.           Follow-up Information   Follow up with Rollene Rotunda, MD. (Our office will call you for appt next week. [place] office preferrred)    Contact information:   [address] [place] [address] [phone]     Time spent with patient to include physician time:40 minutes  Joni Reining 11/27/2012, 3:25 PM  Cardiology Attending Patient interviewed and examined. Discussed with Joni Reining, NP.  Above note annotated and modified based upon my findings.  Patient is doing remarkably well after a large myocardial infarction in the distribution of the left anterior descending coronary artery. He will need close outpatient followup.  [place] Bing, MD 11/27/2012, 4:11 PM

## 2012-11-27 NOTE — Progress Notes (Signed)
CARDIAC REHAB PHASE I   PRE:  Rate/Rhythm: 85 sinus  BP:  Standing: 126/68     MODE:  Ambulation: 890 ft   POST:  Rate/Rhythem: 103 sinus tach  BP:  Sitting: 126/64      Pt ambulated 890 ft with assist x1.  Tolerated well with no complaints.  He had no follow up questions from yesterday's education session.  Fabio Pierce, MA, ACSM RCEP [phone]  Hazle Nordmann

## 2012-11-30 ENCOUNTER — Telehealth: Payer: Self-pay | Admitting: *Deleted

## 2012-11-30 NOTE — Telephone Encounter (Signed)
Patient states that he has one more dose left for his effient in which he will take on tomorrow and can't afford this medication. Nurse advised patient to call office tomorrow and check availability for 30 day free or samples around 1:00 pm. Nurse called drug rep and requested coupons and or samples for our office.

## 2012-12-01 ENCOUNTER — Other Ambulatory Visit: Payer: Self-pay | Admitting: *Deleted

## 2012-12-01 DIAGNOSIS — I10 Essential (primary) hypertension: Secondary | ICD-10-CM

## 2012-12-01 DIAGNOSIS — I2589 Other forms of chronic ischemic heart disease: Secondary | ICD-10-CM

## 2012-12-01 MED ORDER — PRASUGREL HCL 10 MG PO TABS: 10.0000 mg | ORAL_TABLET | Freq: Every day | ORAL | Status: DC

## 2012-12-01 NOTE — Telephone Encounter (Signed)
Patient came to office to pick up samples  

## 2012-12-01 NOTE — Addendum Note (Signed)
Addended by: Eustace Moore on: 12/01/2012 03:46 PM   Modules accepted: Orders

## 2012-12-09 ENCOUNTER — Other Ambulatory Visit (INDEPENDENT_AMBULATORY_CARE_PROVIDER_SITE_OTHER): Payer: Self-pay

## 2012-12-09 ENCOUNTER — Other Ambulatory Visit: Payer: Self-pay

## 2012-12-09 DIAGNOSIS — I2510 Atherosclerotic heart disease of native coronary artery without angina pectoris: Secondary | ICD-10-CM

## 2012-12-09 DIAGNOSIS — I10 Essential (primary) hypertension: Secondary | ICD-10-CM

## 2012-12-09 DIAGNOSIS — I2589 Other forms of chronic ischemic heart disease: Secondary | ICD-10-CM

## 2012-12-16 ENCOUNTER — Encounter: Payer: Self-pay | Admitting: Physician Assistant

## 2012-12-16 ENCOUNTER — Ambulatory Visit (INDEPENDENT_AMBULATORY_CARE_PROVIDER_SITE_OTHER): Payer: Self-pay | Admitting: Physician Assistant

## 2012-12-16 VITALS — BP 180/120 | HR 68 | Ht 67.0 in | Wt 175.0 lb

## 2012-12-16 DIAGNOSIS — I2510 Atherosclerotic heart disease of native coronary artery without angina pectoris: Secondary | ICD-10-CM

## 2012-12-16 DIAGNOSIS — I255 Ischemic cardiomyopathy: Secondary | ICD-10-CM

## 2012-12-16 DIAGNOSIS — I2589 Other forms of chronic ischemic heart disease: Secondary | ICD-10-CM

## 2012-12-16 DIAGNOSIS — Z720 Tobacco use: Secondary | ICD-10-CM

## 2012-12-16 DIAGNOSIS — I429 Cardiomyopathy, unspecified: Secondary | ICD-10-CM

## 2012-12-16 DIAGNOSIS — E785 Hyperlipidemia, unspecified: Secondary | ICD-10-CM | POA: Insufficient documentation

## 2012-12-16 DIAGNOSIS — F17200 Nicotine dependence, unspecified, uncomplicated: Secondary | ICD-10-CM

## 2012-12-16 DIAGNOSIS — I428 Other cardiomyopathies: Secondary | ICD-10-CM

## 2012-12-16 DIAGNOSIS — I10 Essential (primary) hypertension: Secondary | ICD-10-CM | POA: Insufficient documentation

## 2012-12-16 MED ORDER — PANTOPRAZOLE SODIUM 40 MG PO TBEC: 40.0000 mg | DELAYED_RELEASE_TABLET | Freq: Every day | ORAL | Status: DC

## 2012-12-16 MED ORDER — OMEGA-3 FATTY ACIDS 1000 MG PO CAPS: 2.0000 g | ORAL_CAPSULE | Freq: Two times a day (BID) | ORAL | Status: DC

## 2012-12-16 MED ORDER — LISINOPRIL 5 MG PO TABS: 5.0000 mg | ORAL_TABLET | Freq: Two times a day (BID) | ORAL | Status: DC

## 2012-12-16 NOTE — Assessment & Plan Note (Signed)
Patient reports no recurrent symptoms since recent hospitalization. Of note, I reviewed today's markedly abnormal EKG with Dr. Myrtis Ser, who agrees that this represents evolutionary changes. Patient remains asymptomatic. Therefore, continued aggressive medical management and early followup is recommended. Patient to resume routine activities, but is to continue refraining from any strenuous activity for one full month.

## 2012-12-16 NOTE — Assessment & Plan Note (Signed)
Patient has significantly curtailed his tobacco smoking, and appears motivated to stop altogether. He reports having tried nicotine patches in the past, but to no avail.

## 2012-12-16 NOTE — Assessment & Plan Note (Signed)
Recent followup echocardiogram indicated normalization of LVF (55%), compared to EF 30% by emergent cardiac catheterization. Continue therapy with ASA (patient reports today that he has not been taking since discharge), Effient (at least one year), ACE inhibitor, and beta blocker. Of note, patient was not able to tolerate atorvastatin, secondary to angioedema of the lip. Will refer for cardiac rehabilitation and arrange for early followup in our [facility] clinic, at which time he will establish with Dr. Antoine Poche. Patient previously expressed a preference to followup in our [place] clinic, citing convenience.

## 2012-12-16 NOTE — Assessment & Plan Note (Signed)
Repeat BPs, including both arms, indicates uncontrolled HTN (170-180 systolic). Therefore, will increase ACE inhibitor to 5 mg twice a day and arrange for an RN visit in one week for reassessment. Will order followup BMET in one week, for monitoring of potassium and renal function.

## 2012-12-16 NOTE — Patient Instructions (Addendum)
Take Enteric Coated Aspirin 81mg  daily.  You have been referred to Cardiac Rehab at [facility].  You should get established with a Primary Care Physician in [place], [place].  Start Protonix 40mg  daily.  Fasting Labs in 12 weeks:  Lipid/Liver - Lab orders given to patient.  Your physician recommends that you schedule a follow-up appointment in: 2 months with Dr. Antoine Poche.  Your physician recommends that you schedule a follow-up appointment in 1 week with the nurse for a blood pressure check.  Lab work in 1 week:  BMET  Increase Lisinopril to 5mg  twice daily.  Start taking Omega III Fish Oil 2grams twice daily.

## 2012-12-16 NOTE — Assessment & Plan Note (Signed)
Will add omega-3 fish oil 2 g twice a day. Reassess lipid status in 12 weeks.

## 2012-12-16 NOTE — Progress Notes (Signed)
Primary Cardiologist: Rollene Rotunda, MD (new)   HPI: Post hospital followup from [facility] [address], status post presentation with acute anterior STEMI.  Emergent cardiac catheterization yielded severe 1v CAD with 99% ostial LAD stenosis, treated successfully with PTCA/DES. Residual nonobstructive CAD; EF 30%, severe anterior apical/inferior apical HK.   - Echocardiogram, May 21: EF 20%, no definite thrombus   - Echocardiogram, June 5 (outpatient): EF 55%   Patient presented with no known CAD, but with CRFs notable for HTN, tobacco, significant FH, and age.   Of note, prior to discharge patient was diagnosed with possible angioedema of the lower lip, presumed secondary to atorvastatin. He required treatment with epinephrine.  He presents today with no recurrent symptoms associated with his recent MI. He denies any anterior chest discomfort with routine activities or walking. He does complain of significant reflux symptoms, but is currently not on any medication.  He continues to smoke, but has cut back to 1/2 ppd.   Twelve-lead EKG today, reviewed by me, indicates NSR 60 bpm; marked symmetric T wave inversions anterolaterally and inferiorly, but with no Q waves Allergies  Allergen Reactions  . Lipitor (Atorvastatin)     Angioedema    Current Outpatient Prescriptions  Medication Sig Dispense Refill  . aspirin EC 81 MG EC tablet Take 1 tablet (81 mg total) by mouth daily.  30 tablet  10  . lisinopril (PRINIVIL,ZESTRIL) 5 MG tablet Take 1 tablet (5 mg total) by mouth 2 (two) times daily.  180 tablet  3  . metoprolol (LOPRESSOR) 50 MG tablet Take 1 tablet (50 mg total) by mouth 2 (two) times daily.  30 tablet  10  . nitroGLYCERIN (NITROSTAT) 0.4 MG SL tablet Place 1 tablet (0.4 mg total) under the tongue every 5 (five) minutes x 3 doses as needed for chest pain.  30 tablet  12  . prasugrel (EFFIENT) 10 MG TABS Take 1 tablet (10 mg total) by mouth daily.  42 tablet  0  . fish oil-omega-3 fatty acids  1000 MG capsule Take 2 capsules (2 g total) by mouth 2 (two) times daily.  180 capsule  3  . pantoprazole (PROTONIX) 40 MG tablet Take 1 tablet (40 mg total) by mouth daily.  90 tablet  3   No current facility-administered medications for this visit.    Past Medical History  Diagnosis Date  . HTN (hypertension)   . Tobacco abuse     a. 1.5 ppd x 40 yrs  . Inguinal hernia     a. s/p repair x 2 on left  . CAD (coronary artery disease)     Acute anterior STEMI/emergent DES 99% ostial LAD, 11/2012  . Ischemic cardiomyopathy     Initial EF 30%, by cardiac catheterization; followup EF 55%, by echo  . HLD (hyperlipidemia)     Atorvastatin intolerant, secondary to angioedema of the lip    Past Surgical History  Procedure Laterality Date  . Hernia repair      a. L inguinal x 2    History   Social History  . Marital Status: Divorced    Spouse Name: N/A    Number of Children: N/A  . Years of Education: N/A   Occupational History  . Not on file.   Social History Main Topics  . Smoking status: Heavy Tobacco Smoker -- 1.50 packs/day for 40 years  . Smokeless tobacco: Not on file  . Alcohol Use: No  . Drug Use: No  . Sexually Active: Not on file  Other Topics Concern  . Not on file   Social History Narrative   Lives in [place] with his mother.  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    Family History  Problem Relation Age of Onset  . Diabetes Mother     alive @ 37  . Heart attack Father     died @ 13  . Heart attack Brother     died @ 70  . Heart attack Brother     died @ 9  . Cancer Sister     died @ 86    ROS: no nausea, vomiting; no fever, chills; no melena, hematochezia; no claudication  PHYSICAL EXAM: BP 180/120  Pulse 68  Ht 5\' 7"  (1.702 m)  Wt 175 lb (79.379 kg)  BMI 27.4 kg/m2 GENERAL: 62 year-old male; NAD HEENT: NCAT, PERRLA, EOMI; sclera clear; no xanthelasma NECK: palpable bilateral carotid pulses, no  bruits; no JVD; no TM LUNGS: CTA bilaterally CARDIAC: RRR (S1, S2); no significant murmurs; no rubs or gallops ABDOMEN: soft, non-tender; intact BS EXTREMETIES: intact R femoral pulse, no hematoma or bruit; no significant peripheral edema SKIN: warm/dry; no obvious rash/lesions MUSCULOSKELETAL: no joint deformity NEURO: no focal deficit; NL affect   EKG: reviewed and available in Electronic Records   ASSESSMENT & PLAN:  Ischemic cardiomyopathy Recent followup echocardiogram indicated normalization of LVF (55%), compared to EF 30% by emergent cardiac catheterization. Continue therapy with ASA (patient reports today that he has not been taking since discharge), Effient (at least one year), ACE inhibitor, and beta blocker. Of note, patient was not able to tolerate atorvastatin, secondary to angioedema of the lip. Will refer for cardiac rehabilitation and arrange for early followup in our [facility] clinic, at which time he will establish with Dr. Antoine Poche. Patient previously expressed a preference to followup in our [place] clinic, citing convenience.  HLD (hyperlipidemia) Will add omega-3 fish oil 2 g twice a day. Reassess lipid status in 12 weeks.  HTN (hypertension) Repeat BPs, including both arms, indicates uncontrolled HTN (170-180 systolic). Therefore, will increase ACE inhibitor to 5 mg twice a day and arrange for an RN visit in one week for reassessment. Will order followup BMET in one week, for monitoring of potassium and renal function.  CAD (coronary artery disease) Patient reports no recurrent symptoms since recent hospitalization. Of note, I reviewed today's markedly abnormal EKG with Dr. Myrtis Ser, who agrees that this represents evolutionary changes. Patient remains asymptomatic. Therefore, continued aggressive medical management and early followup is recommended. Patient to resume routine activities, but is to continue refraining from any strenuous activity for one full  month.  Tobacco abuse Patient has significantly curtailed his tobacco smoking, and appears motivated to stop altogether. He reports having tried nicotine patches in the past, but to no avail.    Gene Judy Pollman, PAC

## 2012-12-20 ENCOUNTER — Encounter: Payer: Self-pay | Admitting: Cardiology

## 2012-12-20 DIAGNOSIS — IMO0002 Reserved for concepts with insufficient information to code with codable children: Secondary | ICD-10-CM | POA: Insufficient documentation

## 2012-12-20 DIAGNOSIS — R9430 Abnormal result of cardiovascular function study, unspecified: Secondary | ICD-10-CM | POA: Insufficient documentation

## 2012-12-23 ENCOUNTER — Encounter: Payer: Self-pay | Admitting: *Deleted

## 2012-12-23 ENCOUNTER — Ambulatory Visit (INDEPENDENT_AMBULATORY_CARE_PROVIDER_SITE_OTHER): Payer: Medicaid Other | Admitting: *Deleted

## 2012-12-23 VITALS — BP 157/96 | HR 67 | Ht 67.0 in | Wt 174.0 lb

## 2012-12-23 DIAGNOSIS — I10 Essential (primary) hypertension: Secondary | ICD-10-CM

## 2012-12-23 MED ORDER — LISINOPRIL 10 MG PO TABS: 10.0000 mg | ORAL_TABLET | Freq: Two times a day (BID) | ORAL | Status: DC

## 2012-12-23 NOTE — Progress Notes (Signed)
Patient informed and verbalized understanding of plan. 

## 2012-12-23 NOTE — Progress Notes (Signed)
Patient needs to take ASA, or risk having another MI. It is extremely important to take ASA, while also taking Effient. Regarding his BP, I recommend he increase ACE to 10 bid. He also needs to take Omega III fish oil, since he had an allergic reaction to statin therapy, to try to control his lipids. I prescribed Protonix since it is least likely of PPIs to interfere with Effient.

## 2012-12-23 NOTE — Progress Notes (Signed)
Patient presents for nurse visit per last office visit request for BP check. Patient has taken all medications except aspirin, protonix and fish oil. Patient states the aspirin caused him to feel very cold so he stopped it. Patient said he has never started the fish oil or protonix. Patient said protonix was too expensive and he just never picked up fish oil. Patient denies chest pain, dizziness or sob. No side effects to medications he is taking.

## 2012-12-30 ENCOUNTER — Other Ambulatory Visit: Payer: Self-pay | Admitting: *Deleted

## 2012-12-30 MED ORDER — PRASUGREL HCL 10 MG PO TABS: 10.0000 mg | ORAL_TABLET | Freq: Every day | ORAL | Status: DC

## 2012-12-30 NOTE — Telephone Encounter (Signed)
Informed patient to bring proof of insurance so that application for effient assistance can be sent to [employer].

## 2013-01-12 ENCOUNTER — Encounter: Payer: Self-pay | Admitting: *Deleted

## 2013-02-11 ENCOUNTER — Telehealth: Payer: Self-pay | Admitting: *Deleted

## 2013-02-11 NOTE — Telephone Encounter (Signed)
Samples received for Effient 10 mg tablets 4 bottle of 30 tabs each Lot # [id number]  Exp 01/2014 - will forward to [facility] office to get to pt.

## 2013-02-16 ENCOUNTER — Encounter: Payer: Self-pay | Admitting: Cardiology

## 2013-02-16 ENCOUNTER — Ambulatory Visit (INDEPENDENT_AMBULATORY_CARE_PROVIDER_SITE_OTHER): Payer: Self-pay | Admitting: Cardiology

## 2013-02-16 VITALS — BP 137/93 | HR 76 | Ht 67.0 in | Wt 177.0 lb

## 2013-02-16 DIAGNOSIS — I2109 ST elevation (STEMI) myocardial infarction involving other coronary artery of anterior wall: Secondary | ICD-10-CM

## 2013-02-16 DIAGNOSIS — I2510 Atherosclerotic heart disease of native coronary artery without angina pectoris: Secondary | ICD-10-CM

## 2013-02-16 NOTE — Patient Instructions (Addendum)
FASTING LIPID PANEL TO BE DONE 02/18/13 @ 9:30  NO CHANGES WERE MADE TODAY  Your physician wants you to follow-up in: 6 MONTHS WITH DR. HOCHREIN. You will receive a reminder letter in the mail two months in advance. If you don't receive a letter, please call our office to schedule the follow-up appointment.

## 2013-02-16 NOTE — Progress Notes (Signed)
HPI The patient presents for followup of an acute myocardial infarction earlier this year. He had 99% ostial LAD stenosis treated with a drug-eluting stent. Initially her ejection fraction was about 20% but improved to 55% on followup echo in June.  Since he was last seen he has had no further cardiovascular complaints.  The patient denies any new symptoms such as chest discomfort, neck or arm discomfort. There has been no new shortness of breath, PND or orthopnea. There have been no reported palpitations, presyncope or syncope.  He does continue to smoke cigarettes.   Allergies  Allergen Reactions  . Lipitor [Atorvastatin]     Angioedema    Current Outpatient Prescriptions  Medication Sig Dispense Refill  . aspirin EC 81 MG EC tablet Take 1 tablet (81 mg total) by mouth daily.  30 tablet  10  . fish oil-omega-3 fatty acids 1000 MG capsule Take 2 capsules (2 g total) by mouth 2 (two) times daily.  180 capsule  3  . lisinopril (PRINIVIL,ZESTRIL) 10 MG tablet Take 1 tablet (10 mg total) by mouth 2 (two) times daily.  180 tablet  3  . metoprolol (LOPRESSOR) 50 MG tablet Take 1 tablet (50 mg total) by mouth 2 (two) times daily.  30 tablet  10  . nitroGLYCERIN (NITROSTAT) 0.4 MG SL tablet Place 1 tablet (0.4 mg total) under the tongue every 5 (five) minutes x 3 doses as needed for chest pain.  30 tablet  12  . pantoprazole (PROTONIX) 40 MG tablet Take 1 tablet (40 mg total) by mouth daily.  90 tablet  3  . prasugrel (EFFIENT) 10 MG TABS Take 1 tablet (10 mg total) by mouth daily.  56 tablet  0   No current facility-administered medications for this visit.    Past Medical History  Diagnosis Date  . HTN (hypertension)   . Tobacco abuse     a. 1.5 ppd x 40 yrs  . Inguinal hernia     a. s/p repair x 2 on left  . CAD (coronary artery disease)     Acute anterior STEMI/emergent DES 99% ostial LAD, 11/2012  . Ischemic cardiomyopathy     Initial EF 30%, by cardiac catheterization; followup EF  55%, by echo  . HLD (hyperlipidemia)     Atorvastatin intolerant, secondary to angioedema of the lip  . Ejection fraction     EF 30%, at time of acute MI    //   EF 55%, echo, June, 2014, hypokinesis of the distal anteroseptal wall    Past Surgical History  Procedure Laterality Date  . Hernia repair      a. L inguinal x 2    ROS:  As stated in the HPI and negative for all other systems.  PHYSICAL EXAM BP 137/93  Pulse 76  Ht 5\' 7"  (1.702 m)  Wt 177 lb (80.287 kg)  BMI 27.72 kg/m2 GENERAL:  Well appearing HEENT:  Pupils equal round and reactive, fundi not visualized, oral mucosa unremarkable NECK:  No jugular venous distention, waveform within normal limits, carotid upstroke brisk and symmetric, no bruits, no thyromegaly LYMPHATICS:  No cervical, inguinal adenopathy LUNGS:  Clear to auscultation bilaterally BACK:  No CVA tenderness CHEST:  Unremarkable HEART:  PMI not displaced or sustained,S1 and S2 within normal limits, no S3, no S4, no clicks, no rubs, no murmurs ABD:  Flat, positive bowel sounds normal in frequency in pitch, no bruits, no rebound, no guarding, no midline pulsatile mass, no hepatomegaly, no splenomegaly  EXT:  2 plus pulses throughout, no edema, no cyanosis no clubbing SKIN:  No rashes no nodules NEURO:  Cranial nerves II through XII grossly intact, motor grossly intact throughout PSYCH:  Cognitively intact, oriented to person place and time   ASSESSMENT AND PLAN  CAD:  The patient has no new sypmtoms.  No further cardiovascular testing is indicated.  We will continue with aggressive risk reduction and meds as listed.  HTN:  The blood pressure is at target. No change in medications is indicated. We will continue with therapeutic lifestyle changes (TLC).  TOBACCO ABUSE:  We talked about this. He does not want any prescriptions or other over-the-counter medications  DYSLIPIDEMIA:  He did no tolerate statins with angioedema.  I will check a fasting lipid  profile on fish oil.

## 2013-02-18 ENCOUNTER — Ambulatory Visit (INDEPENDENT_AMBULATORY_CARE_PROVIDER_SITE_OTHER): Payer: Self-pay | Admitting: *Deleted

## 2013-02-18 DIAGNOSIS — I2510 Atherosclerotic heart disease of native coronary artery without angina pectoris: Secondary | ICD-10-CM

## 2013-02-18 LAB — LIPID PANEL
HDL: 39.4 mg/dL (ref >39.00)
VLDL: 46 mg/dL — ABNORMAL HIGH (ref 0.0–40.0)

## 2013-02-18 LAB — LDL CHOLESTEROL, DIRECT: Direct LDL: 210.1 mg/dL

## 2013-02-23 NOTE — Progress Notes (Signed)
He has not tolerated statins.  He should go to [facility] in [place].

## 2013-03-11 ENCOUNTER — Other Ambulatory Visit: Payer: Self-pay | Admitting: *Deleted

## 2013-03-11 DIAGNOSIS — E7800 Pure hypercholesterolemia, unspecified: Secondary | ICD-10-CM

## 2013-03-15 ENCOUNTER — Emergency Department (HOSPITAL_COMMUNITY)
Admission: EM | Admit: 2013-03-15 | Discharge: 2013-03-15 | Disposition: A | Discharge status: Home / Self Care | Payer: Self-pay | Attending: Emergency Medicine | Admitting: Emergency Medicine

## 2013-03-15 ENCOUNTER — Encounter (HOSPITAL_COMMUNITY): Payer: Self-pay | Admitting: Emergency Medicine

## 2013-03-15 DIAGNOSIS — F17200 Nicotine dependence, unspecified, uncomplicated: Secondary | ICD-10-CM | POA: Insufficient documentation

## 2013-03-15 DIAGNOSIS — Z7982 Long term (current) use of aspirin: Secondary | ICD-10-CM | POA: Insufficient documentation

## 2013-03-15 DIAGNOSIS — E785 Hyperlipidemia, unspecified: Secondary | ICD-10-CM | POA: Insufficient documentation

## 2013-03-15 DIAGNOSIS — Z8679 Personal history of other diseases of the circulatory system: Secondary | ICD-10-CM | POA: Insufficient documentation

## 2013-03-15 DIAGNOSIS — Z8719 Personal history of other diseases of the digestive system: Secondary | ICD-10-CM | POA: Insufficient documentation

## 2013-03-15 DIAGNOSIS — T4995XA Adverse effect of unspecified topical agent, initial encounter: Secondary | ICD-10-CM | POA: Insufficient documentation

## 2013-03-15 DIAGNOSIS — T783XXA Angioneurotic edema, initial encounter: Secondary | ICD-10-CM | POA: Insufficient documentation

## 2013-03-15 DIAGNOSIS — I2510 Atherosclerotic heart disease of native coronary artery without angina pectoris: Secondary | ICD-10-CM | POA: Insufficient documentation

## 2013-03-15 DIAGNOSIS — Z79899 Other long term (current) drug therapy: Secondary | ICD-10-CM | POA: Insufficient documentation

## 2013-03-15 DIAGNOSIS — I10 Essential (primary) hypertension: Secondary | ICD-10-CM | POA: Insufficient documentation

## 2013-03-15 MED ORDER — METHYLPREDNISOLONE SODIUM SUCC 125 MG IJ SOLR
125.0000 mg | Freq: Once | INTRAMUSCULAR | Status: AC
  Administered 2013-03-15: 125 mg via INTRAVENOUS
  Filled 2013-03-15: qty 2

## 2013-03-15 MED ORDER — PREDNISONE 10 MG PO TABS: 60.0000 mg | ORAL_TABLET | Freq: Every day | ORAL | Status: DC

## 2013-03-15 MED ORDER — DIPHENHYDRAMINE HCL 25 MG PO TABS: 25.0000 mg | ORAL_TABLET | Freq: Four times a day (QID) | ORAL | Status: DC

## 2013-03-15 MED ORDER — DIPHENHYDRAMINE HCL 50 MG/ML IJ SOLN
25.0000 mg | Freq: Once | INTRAMUSCULAR | Status: AC
  Administered 2013-03-15: 25 mg via INTRAVENOUS
  Filled 2013-03-15: qty 1

## 2013-03-15 MED ORDER — FAMOTIDINE IN NACL 20-0.9 MG/50ML-% IV SOLN
20.0000 mg | Freq: Once | INTRAVENOUS | Status: AC
  Administered 2013-03-15: 20 mg via INTRAVENOUS
  Filled 2013-03-15: qty 50

## 2013-03-15 NOTE — ED Provider Notes (Signed)
CSN: [id number]     Arrival date & time 03/15/13  1011 History   First MD Initiated Contact with Patient 03/15/13 1018     Chief Complaint  Patient presents with  . Facial Swelling   (Consider location/radiation/quality/duration/timing/severity/associated sxs/prior Treatment) HPI Comments: 62 yo male with lip swelling.  Started about 5 hours ago.  Gradually worsening.  No SOB, no throat swelling, no hives or itching, no dizziness or syncope.  Mild abdominal discomfort which is typical for him.    Patient is a 62 y.o. male presenting with allergic reaction.  Allergic Reaction Presenting symptoms: swelling (lips)   Severity:  Moderate Prior episodes: once. Context: medications (takes lisinopril)   Context: no food allergies and no new detergents/soaps   Relieved by:  Nothing Worsened by:  Nothing tried Ineffective treatments:  None tried   Past Medical History  Diagnosis Date  . HTN (hypertension)   . Tobacco abuse     a. 1.5 ppd x 40 yrs  . Inguinal hernia     a. s/p repair x 2 on left  . CAD (coronary artery disease)     Acute anterior STEMI/emergent DES 99% ostial LAD, 11/2012  . Ischemic cardiomyopathy     Initial EF 30%, by cardiac catheterization; followup EF 55%, by echo  . HLD (hyperlipidemia)     Atorvastatin intolerant, secondary to angioedema of the lip  . Ejection fraction     EF 30%, at time of acute MI    //   EF 55%, echo, June, 2014, hypokinesis of the distal anteroseptal wall   Past Surgical History  Procedure Laterality Date  . Hernia repair      a. L inguinal x 2  . Coronary angioplasty with stent placement     Family History  Problem Relation Age of Onset  . Diabetes Mother     alive @ 50  . Heart attack Father     died @ 52  . Heart attack Brother     died @ 72  . Heart attack Brother     died @ 43  . Cancer Sister     died @ 47   History  Substance Use Topics  . Smoking status: Heavy Tobacco Smoker -- 1.50 packs/day for 40 years  .  Smokeless tobacco: Not on file  . Alcohol Use: No    Review of Systems  Constitutional: Negative for fever.  HENT: Negative for congestion.   Respiratory: Negative for cough and shortness of breath.   Cardiovascular: Negative for chest pain.  Gastrointestinal: Negative for nausea, vomiting, abdominal pain and diarrhea.  All other systems reviewed and are negative.    Allergies  Lipitor  Home Medications   Current Outpatient Rx  Name  Route  Sig  Dispense  Refill  . aspirin EC 81 MG EC tablet   Oral   Take 1 tablet (81 mg total) by mouth daily.   30 tablet   10   . lisinopril (PRINIVIL,ZESTRIL) 10 MG tablet   Oral   Take 1 tablet (10 mg total) by mouth 2 (two) times daily.   180 tablet   3     Dose increase   . metoprolol (LOPRESSOR) 50 MG tablet   Oral   Take 50 mg by mouth daily.         . prasugrel (EFFIENT) 10 MG TABS   Oral   Take 1 tablet (10 mg total) by mouth daily.   56 tablet   0  LOT #[id number] A EXP--02/2014   . nitroGLYCERIN (NITROSTAT) 0.4 MG SL tablet   Sublingual   Place 1 tablet (0.4 mg total) under the tongue every 5 (five) minutes x 3 doses as needed for chest pain.   30 tablet   12    BP 159/97  Pulse 71  Resp 20  SpO2 97% Physical Exam  Nursing note and vitals reviewed. Constitutional: He is oriented to person, place, and time. He appears well-developed and well-nourished. No distress.  HENT:  Head: Normocephalic and atraumatic.  Mouth/Throat: Oropharynx is clear and moist. No edematous. No posterior oropharyngeal edema.  Lip swelling.  Upper > lower.  Left side > right side.  No stridor.  Eyes: Conjunctivae are normal. Pupils are equal, round, and reactive to light. No scleral icterus.  Neck: Neck supple.  Cardiovascular: Normal rate, regular rhythm, normal heart sounds and intact distal pulses.   No murmur heard. Pulmonary/Chest: Effort normal and breath sounds normal. No stridor. No respiratory distress. He has no  wheezes. He has no rales.  Abdominal: Soft. He exhibits no distension. There is no tenderness.  Musculoskeletal: Normal range of motion. He exhibits no edema.  Neurological: He is alert and oriented to person, place, and time.  Skin: Skin is warm and dry. No rash noted.  Psychiatric: He has a normal mood and affect. His behavior is normal.    ED Course  Procedures (including critical care time) Labs Review Labs Reviewed - No data to display Imaging Review No results found.  MDM   1. Angioedema of lips, initial encounter    Angioedema likely from lisinopril.  No airway compromise.  Will monitor.  IV steroids, benadryl, and pepcid.  Plan to monitor airway in ED  Monitored in ED for several hours with improvement in symptoms.  DC'd home with followup.  Instructed to DC lisinopril and contact his doctor.    Candyce Churn, MD 03/16/13 1515

## 2013-03-15 NOTE — ED Notes (Signed)
Face swelling since 5 am has been taking bp meds since heart att in may. Pt having no trouble breathing or swollowing and is handling secreations well

## 2013-03-21 ENCOUNTER — Ambulatory Visit (INDEPENDENT_AMBULATORY_CARE_PROVIDER_SITE_OTHER): Payer: Self-pay | Admitting: Pharmacist

## 2013-03-21 ENCOUNTER — Telehealth: Payer: Self-pay | Admitting: Physician Assistant

## 2013-03-21 ENCOUNTER — Ambulatory Visit: Payer: Self-pay | Admitting: Pharmacist

## 2013-03-21 VITALS — Wt 174.8 lb

## 2013-03-21 DIAGNOSIS — Z720 Tobacco use: Secondary | ICD-10-CM

## 2013-03-21 DIAGNOSIS — F17200 Nicotine dependence, unspecified, uncomplicated: Secondary | ICD-10-CM

## 2013-03-21 DIAGNOSIS — E785 Hyperlipidemia, unspecified: Secondary | ICD-10-CM

## 2013-03-21 NOTE — Patient Instructions (Addendum)
Start Crestor 10mg  daily.  If you start having any problems, please call Kennon Rounds at [phone].  Try to cut out the fried meats and foods  Recheck labs in 6 weeks.

## 2013-03-21 NOTE — Telephone Encounter (Signed)
Patient reports recurrent lip swelling. He has a history of ACE inhibitor- and statin-induced angioedema. He was started on a trial of Crestor today. He reports experiencing lip and jaw swelling c/w prior episodes. He denies tongue or oropharynx swelling. No compromise of airway, stridor or dyspnea per patient. No appreciable slurred speech over the phone. He has not had compromise of his airway in the past when this has occurred after statin or ACEi use. Advised to take benadryl (has this at home), stop Crestor altogether and continue to monitor. If swelling worsens in any way or begins to affect oropharynx or respiratory status, advised to call 911 immediately. He understood and agreed. If recurrent angioedema occurs off known inciting meds, consider daily antihistamines.    Jacqulyn Bath, PA-C 03/21/2013 5:48 PM

## 2013-03-22 ENCOUNTER — Encounter: Payer: Self-pay | Admitting: Pharmacist

## 2013-03-22 NOTE — Assessment & Plan Note (Signed)
Pt's cholesterol uncontrolled.  TC- 288, TG- 230, HDL- 39, LDL- 210 (goal<70).  Pt would greatly benefit from statin.  Given original presentation and fact pt reports taking cholesterol medications in the past, not complete sure reaction was related to Lipitor.  He had an additional reaction last week to lisinopril, but continued taking with no problems.  ? Related to environmental cause rather than medication.  Discussed with pt.  He is willing to retry statin.  Will try Crestor 10mg  daily.  Had pt take first dose in the office with no reaction prior to him leaving.  Will recheck labs in 6 weeks.

## 2013-03-22 NOTE — Assessment & Plan Note (Signed)
Had long discussion with pt regarding smoking cessation.  Pt states he does not want to stop now but may think about it in the future.  Biggest trigger seems to be out of habit and being around other smokers.  He has quit for up to 3 weeks in the past.  Will continue to discuss with patient.

## 2013-03-22 NOTE — Progress Notes (Signed)
HPI  [name] is a 62 yo M pt of Dr. Antoine Poche. He has a history of CAD s/p MI in May with a drug-eluding stent to the LAD.  He has a history of hyperlipidemia.  There is questionable angioedema with statins so he is currently on no therapy.  Pt states he has been on cholesterol medications in the past but cannot recall when or what.  During his hospitalization in May, he had an episode of lip swelling (no throat or tongue involvement).  He had taken atorvastatin, metoprolol, prasugrel and aspirin the day prior.  Best guess was that it was related to atorvastatin so this was stopped and he was never retried on a statin.  On 9/9, he went to ER for facial swelling.  At that time he was taking lisinopril, metoprolol, effient, and aspirin.  Swelling was thought to be related to lisinopril so he was instructed to stop this.  Pt got confused on which blood pressure medication to stop and actually stopped metoprolol rather than lisinopril.  Took lisinopril this morning.  Has not had any additional problems.   Reviewed pt's family history.  Father had MI in 33s, brother with MI at 55, and a sister with heart problems who died in her 61s.    He is a smoker- 1ppd for >40 years.  Discussed smoking cessation but pt is not interested at this time.   Diet: Pt eats a very typical "Southern" diet.  He likes fried foods and carbohydrates.  He does not eat breakfast.  He likes hot dogs, cheeseburgers ([employer], [employer], [employer]) and french fries for lunch.  He does eat dinner at home but often fried meat, fried squash, pinto beans, cornbread, and biscuits.  He eats sweets every other day or so.  He drinks 2 Dr. Alcus Dad each day, water, milk, and OJ.   Pt does not get any regular exercise at this time.  He does sporadic yard work.   Current Outpatient Prescriptions  Medication Sig Dispense Refill  . aspirin EC 81 MG EC tablet Take 1 tablet (81 mg total) by mouth daily.  30 tablet  10  . metoprolol (LOPRESSOR) 50 MG  tablet Take 50 mg by mouth daily.      . nitroGLYCERIN (NITROSTAT) 0.4 MG SL tablet Place 1 tablet (0.4 mg total) under the tongue every 5 (five) minutes x 3 doses as needed for chest pain.  30 tablet  12  . prasugrel (EFFIENT) 10 MG TABS Take 1 tablet (10 mg total) by mouth daily.  56 tablet  0   No current facility-administered medications for this visit.    Allergies  Allergen Reactions  . Lipitor [Atorvastatin]     Angioedema

## 2013-03-22 NOTE — Telephone Encounter (Signed)
Spoke with pt this morning.  He took 2 benadryl last night and has had no other issues.  Of note, pt also took lisinopril yesterday morning.  He took the first dose of Crestor in the office around 4:30 pm.  Still unsure which may have caused this reaction.  Will avoid statins and ACE-I in the future.  Leaves fewer options to treat his LDL.  Will follow up as previously scheduled.

## 2013-05-02 ENCOUNTER — Ambulatory Visit (INDEPENDENT_AMBULATORY_CARE_PROVIDER_SITE_OTHER): Payer: Medicaid Other | Admitting: Pharmacist

## 2013-05-02 DIAGNOSIS — E785 Hyperlipidemia, unspecified: Secondary | ICD-10-CM

## 2013-05-02 MED ORDER — FENOFIBRATE 160 MG PO TABS: 160.0000 mg | ORAL_TABLET | Freq: Every day | ORAL | Status: DC

## 2013-05-02 NOTE — Progress Notes (Signed)
HPI  [name] is a 62 yo M pt of Kyle Finley. He has a history of CAD s/p MI in May with a drug-eluding stent to the LAD.  He has a history of hyperlipidemia.  There was questionable angioedema with statins.  At last visit, we gave him a dose of Crestor in the office but pt states he started having swelling in his face as soon as he drove home.  He has taken no additional statin and no other problems.   Pt states he has been on cholesterol medications in the past but cannot recall when or what.    Reviewed pt's family history.  Kyle Finley had MI in 36s, Kyle Finley with MI at 56, and a Kyle Finley with heart problems who died in her 46s.    He is a smoker- 1ppd for >40 years.  Discussed smoking cessation but pt is not interested at this time.   Diet: Pt eats a very typical "Southern" diet.  He likes fried foods and carbohydrates.  He has tried to cut back on his fried foods but still eats these a few times of the week. He has made no additional changes to his diet. He does not eat breakfast.  He likes hot dogs, cheeseburgers ([employer], [employer], [employer]) and french fries for lunch.  He does eat dinner at home but often fried meat, fried squash, pinto beans, cornbread, and biscuits.  He eats sweets every other day or so.  He drinks 2 Dr. Alcus Dad each day, water, milk, and OJ.    Pt does not get any regular exercise at this time.  He does sporadic yard work.   Current Outpatient Prescriptions  Medication Sig Dispense Refill  . aspirin EC 81 MG EC tablet Take 1 tablet (81 mg total) by mouth daily.  30 tablet  10  . fenofibrate 160 MG tablet Take 1 tablet (160 mg total) by mouth daily.  30 tablet  6  . metoprolol (LOPRESSOR) 50 MG tablet Take 50 mg by mouth daily.      . nitroGLYCERIN (NITROSTAT) 0.4 MG SL tablet Place 1 tablet (0.4 mg total) under the tongue every 5 (five) minutes x 3 doses as needed for chest pain.  30 tablet  12  . prasugrel (EFFIENT) 10 MG TABS Take 1 tablet (10 mg total) by mouth daily.   56 tablet  0   No current facility-administered medications for this visit.    Allergies  Allergen Reactions  . Lipitor [Atorvastatin]     Lip swelling  . Lisinopril Swelling    Lip swelling

## 2013-05-02 NOTE — Patient Instructions (Signed)
Start fenofibrate 160mg  once daily.  Take with food.    Try to continue to limit fried foods.  Recheck labs in 3 months.

## 2013-05-02 NOTE — Assessment & Plan Note (Signed)
No new labs available since last visit.  LDL- 210.  Needs treatment but will not be able to use statins since he did have a reaction last time.  Discussed other options including fenofibrate, niacin and Zetia.  Will try fenofibrate at this time.  Pt aware to call with any problems. Will follow up in 3 months.

## 2013-05-23 ENCOUNTER — Telehealth: Payer: Self-pay | Admitting: Adult Health

## 2013-05-23 NOTE — Telephone Encounter (Signed)
Received fax refill request  Rx # [id number] Medication:  Lopressor 50 mg tab Qty 30 Sig:  Take one tablet by mouth twice daily Physician:  Lyman Bishop

## 2013-05-26 ENCOUNTER — Other Ambulatory Visit: Payer: Self-pay | Admitting: *Deleted

## 2013-05-26 MED ORDER — METOPROLOL TARTRATE 50 MG PO TABS: 50.0000 mg | ORAL_TABLET | Freq: Every day | ORAL | Status: DC

## 2013-05-26 NOTE — Telephone Encounter (Signed)
Refill complete. Per patient and recent visits and hospital encounter patient takes one daily.

## 2013-07-05 ENCOUNTER — Telehealth: Payer: Self-pay | Admitting: Internal Medicine

## 2013-07-05 MED ORDER — PRASUGREL HCL 10 MG PO TABS: 10.0000 mg | ORAL_TABLET | Freq: Every day | ORAL | Status: DC

## 2013-07-05 NOTE — Telephone Encounter (Signed)
Wants to know if we have free samples of Metoprolol

## 2013-07-05 NOTE — Telephone Encounter (Signed)
Spoke with patient while he was calling to confirm exact location of office. After giving information of office location, patient informed nurse that he was awaiting a call back about samples. Nurse reviewed phone note request and informed patient that we don't give samples of metoprolol. Nurse advised patient that effient had been given before and also suggested that he needed refill on effient since he gets free samples from the company. Patient confirmed that effient is what he needed. Patient said he was completely out of effient. Nurse advised patient that 2 weeks worth would be left up front and reorder from [employer] would be placed today. Patient verbalized understanding of plan.

## 2013-07-29 ENCOUNTER — Encounter: Payer: Self-pay | Admitting: Cardiology

## 2013-07-29 ENCOUNTER — Encounter (INDEPENDENT_AMBULATORY_CARE_PROVIDER_SITE_OTHER): Payer: Self-pay

## 2013-07-29 ENCOUNTER — Telehealth: Payer: Self-pay

## 2013-07-29 ENCOUNTER — Ambulatory Visit (INDEPENDENT_AMBULATORY_CARE_PROVIDER_SITE_OTHER): Payer: Self-pay | Admitting: Cardiology

## 2013-07-29 ENCOUNTER — Telehealth: Payer: Self-pay | Admitting: *Deleted

## 2013-07-29 VITALS — BP 154/94 | HR 90 | Ht 67.0 in | Wt 178.0 lb

## 2013-07-29 DIAGNOSIS — I10 Essential (primary) hypertension: Secondary | ICD-10-CM

## 2013-07-29 MED ORDER — CLONIDINE HCL 0.1 MG PO TABS: 0.1000 mg | ORAL_TABLET | ORAL | Status: DC | PRN

## 2013-07-29 MED ORDER — AMLODIPINE BESYLATE 5 MG PO TABS: 5.0000 mg | ORAL_TABLET | Freq: Every day | ORAL | Status: DC

## 2013-07-29 MED ORDER — CLONIDINE HCL 0.1 MG PO TABS
0.1000 mg | ORAL_TABLET | Freq: Once | ORAL | Status: AC
  Administered 2013-07-29: 0.1 mg via ORAL

## 2013-07-29 NOTE — Telephone Encounter (Signed)
Patient called states that the Metoprolol  Has made his face swollen,will forward a message to  Dr Antoine PocheHochrein

## 2013-07-29 NOTE — Telephone Encounter (Signed)
Error

## 2013-07-29 NOTE — Progress Notes (Signed)
HPI The patient presents for followup of an acute myocardial infarction earlier this year. He had 99% ostial LAD stenosis treated with a drug-eluting stent. Initially her ejection fraction was about 20% but improved to 55% on followup echo in June.  He walked into the office today complaining of facial swelling. He said this was on his right side and moved to his left side. He thought was his Toprol and hasn't taken it today. He did take a Benadryl and the facial swelling is improved. He hasn't noticed any other swelling elsewhere. He's had no fevers or chills. He's had no chest pressure, neck or arm discomfort. He's not had any neck discomfort although he's been a little uncomfortable where his jaw was swelling. He hasn't had any other problems such as oral ulcers or pain. He's had no trauma.   Allergies  Allergen Reactions  . Lipitor [Atorvastatin]     Lip swelling  . Lisinopril Swelling    Lip swelling    Current Outpatient Prescriptions  Medication Sig Dispense Refill  . aspirin EC 81 MG EC tablet Take 1 tablet (81 mg total) by mouth daily.  30 tablet  10  . nitroGLYCERIN (NITROSTAT) 0.4 MG SL tablet Place 1 tablet (0.4 mg total) under the tongue every 5 (five) minutes x 3 doses as needed for chest pain.  30 tablet  12  . prasugrel (EFFIENT) 10 MG TABS tablet Take 1 tablet (10 mg total) by mouth daily.  14 tablet  0   No current facility-administered medications for this visit.    Past Medical History  Diagnosis Date  . HTN (hypertension)   . Tobacco abuse     a. 1.5 ppd x 40 yrs  . Inguinal hernia     a. s/p repair x 2 on left  . CAD (coronary artery disease)     Acute anterior STEMI/emergent DES 99% ostial LAD, 11/2012  . Ischemic cardiomyopathy     Initial EF 30%, by cardiac catheterization; followup EF 55%, by echo  . HLD (hyperlipidemia)     Atorvastatin intolerant, secondary to angioedema of the lip  . Ejection fraction     EF 30%, at time of acute MI    //   EF 55%,  echo, June, 2014, hypokinesis of the distal anteroseptal wall    Past Surgical History  Procedure Laterality Date  . Hernia repair      a. L inguinal x 2  . Coronary angioplasty with stent placement      ROS:  As stated in the HPI and negative for all other systems.  PHYSICAL EXAM BP 200/118  Pulse 90  Ht 5\' 7"  (1.702 m)  Wt 178 lb (80.74 kg)  BMI 27.87 kg/m2 GENERAL:  Well appearing HEENT:  Pupils equal round and reactive, fundi not visualized, oral mucosa unremarkable(I don't appreciate any significant swelling) NECK:  No jugular venous distention, waveform within normal limits, carotid upstroke brisk and symmetric, no bruits, no thyromegaly LYMPHATICS:  No cervical, inguinal adenopathy LUNGS:  Clear to auscultation bilaterally BACK:  No CVA tenderness CHEST:  Unremarkable HEART:  PMI not displaced or sustained,S1 and S2 within normal limits, no S3, no S4, no clicks, no rubs, no murmurs ABD:  Flat, positive bowel sounds normal in frequency in pitch, no bruits, no rebound, no guarding, no midline pulsatile mass, no hepatomegaly, no splenomegaly EXT:  2 plus pulses throughout, no edema, no cyanosis no clubbing SKIN:  No rashes no nodules    ASSESSMENT AND PLAN  CAD:  The patient has no new sypmtoms.  No further cardiovascular testing is indicated.  We will continue with aggressive risk reduction and meds as listed.  HTN:  His blood pressure was treated with clonidine. It did come down nicely after the second 154/94. We watched him for over an hour with this is stable blood pressure with no further fall. He felt much better.  He was having no problems with lightheadedness or dizziness or other symptoms at that time including. I will switch him to Norvasc 5 mg daily. He will get a blood pressure cuff to keep a record. I will give him a when necessary clonidine to take should he have any accelerated blood pressure like this and if he is having to use this routinely he will let me  know. At this point he will stop his beta blocker. I am not sure that this was causing his facial swelling which seems to have now resolved.  TOBACCO ABUSE:  We talked about this. He does not want any prescriptions or other over-the-counter medications  DYSLIPIDEMIA:  He did no tolerate statins.  He has been very sensitive to medications.  He will try diet control.

## 2013-07-29 NOTE — Patient Instructions (Addendum)
Please start Amlodipine 5 mg a day Take Clonidine 0.1 mg once if BP is greater than 190. Continue all other medications as listed.  Follow up in 4 months with Dr Antoine PocheHochrein.  You will receive a letter in the mail 2 months before you are due.  Please call us when you receive this letter to schedule your follow up appointment.

## 2013-07-29 NOTE — Telephone Encounter (Signed)
Saw patient who arrived at office reports facial swelling due to metoprolol.  He reports it started on right face, neck and spread to left face, mouth.  He has been taking benadryl which he reports has helped.  Last dose of metoprolol was yesterday 1/22.  States he had this prescription for a while but only started the medication on 1/19.  Spoke with Dr. Antoine PocheHochrein (DOD) who can see pt at 2pm today.  Pt will return for appt this afternoon.

## 2013-08-01 ENCOUNTER — Other Ambulatory Visit: Payer: Medicaid Other

## 2013-08-03 ENCOUNTER — Ambulatory Visit: Payer: Medicaid Other | Admitting: Pharmacist

## 2013-11-23 ENCOUNTER — Telehealth: Payer: Self-pay | Admitting: *Deleted

## 2013-11-23 MED ORDER — PRASUGREL HCL 10 MG PO TABS: 10.0000 mg | ORAL_TABLET | Freq: Every day | ORAL | Status: DC

## 2013-11-23 NOTE — Telephone Encounter (Signed)
#  14 tabs Effient 10mg  daily provided for patient.  He will pick up tomorrow.  Looks like he has been getting medication through [employer].  Looks like last date on fax request was 07/05/2013 - requesting refill today.

## 2014-01-18 ENCOUNTER — Encounter: Payer: Self-pay | Admitting: Cardiology

## 2014-01-18 ENCOUNTER — Ambulatory Visit (INDEPENDENT_AMBULATORY_CARE_PROVIDER_SITE_OTHER): Payer: Self-pay | Admitting: Cardiology

## 2014-01-18 VITALS — BP 185/101 | HR 92 | Ht 67.0 in | Wt 169.0 lb

## 2014-01-18 DIAGNOSIS — I2510 Atherosclerotic heart disease of native coronary artery without angina pectoris: Secondary | ICD-10-CM

## 2014-01-18 DIAGNOSIS — I10 Essential (primary) hypertension: Secondary | ICD-10-CM

## 2014-01-18 MED ORDER — AMLODIPINE BESYLATE 10 MG PO TABS: 10.0000 mg | ORAL_TABLET | Freq: Every day | ORAL | Status: DC

## 2014-01-18 MED ORDER — NITROGLYCERIN 0.4 MG SL SUBL: 0.4000 mg | SUBLINGUAL_TABLET | SUBLINGUAL | Status: AC | PRN

## 2014-01-18 MED ORDER — NITROGLYCERIN 0.4 MG SL SUBL: 0.4000 mg | SUBLINGUAL_TABLET | SUBLINGUAL | Status: DC | PRN

## 2014-01-18 NOTE — Progress Notes (Signed)
HPI The patient presents for followup of an acute myocardial infarction earlier this year. He had 99% ostial LAD stenosis treated with a drug-eluting stent. Initially her ejection fraction was about 20% but improved to 55% on followup echo in June of 2014.  He has had difficult to control BP.  He continues to smoke cigarettes. At the last visit his blood pressure was elevated and he went home with when necessary clonidine but he didn't take this. He did get a blood pressure, it was suggested. He doesn't report any cardiovascular complaints however.  The patient denies any new symptoms such as chest discomfort, neck or arm discomfort. There has been no new shortness of breath, PND or orthopnea. There have been no reported palpitations, presyncope or syncope. He's not exercising but he does some yard work and has not limitations with this.  He does not have a primary care provider.   Allergies  Allergen Reactions  . Lipitor [Atorvastatin]     Lip swelling  . Lisinopril Swelling    Lip swelling    Current Outpatient Prescriptions  Medication Sig Dispense Refill  . amLODipine (NORVASC) 5 MG tablet Take 1 tablet (5 mg total) by mouth daily.  30 tablet  11  . aspirin EC 81 MG EC tablet Take 1 tablet (81 mg total) by mouth daily.  30 tablet  10  . nitroGLYCERIN (NITROSTAT) 0.4 MG SL tablet Place 1 tablet (0.4 mg total) under the tongue every 5 (five) minutes x 3 doses as needed for chest pain.  30 tablet  12  . prasugrel (EFFIENT) 10 MG TABS tablet Take 1 tablet (10 mg total) by mouth daily.  14 tablet  0  . cloNIDine (CATAPRES) 0.1 MG tablet Take 1 tablet (0.1 mg total) by mouth as needed.  30 tablet  3   No current facility-administered medications for this visit.    Past Medical History  Diagnosis Date  . HTN (hypertension)   . Tobacco abuse     a. 1.5 ppd x 40 yrs  . Inguinal hernia     a. s/p repair x 2 on left  . CAD (coronary artery disease)     Acute anterior STEMI/emergent DES  99% ostial LAD, 11/2012  . Ischemic cardiomyopathy     Initial EF 30%, by cardiac catheterization; followup EF 55%, by echo  . HLD (hyperlipidemia)     Atorvastatin intolerant, secondary to angioedema of the lip  . Ejection fraction     EF 30%, at time of acute MI    //   EF 55%, echo, June, 2014, hypokinesis of the distal anteroseptal wall    Past Surgical History  Procedure Laterality Date  . Hernia repair      a. L inguinal x 2  . Coronary angioplasty with stent placement      ROS:  As stated in the HPI and negative for all other systems.  PHYSICAL EXAM BP 185/101  Pulse 92  Ht 5\' 7"  (1.702 m)  Wt 169 lb (76.658 kg)  BMI 26.46 kg/m2 GENERAL:  Well appearing HEENT:  Pupils equal round and reactive, fundi not visualized, oral mucosa unremarkable(I don't appreciate any significant swelling) NECK:  No jugular venous distention, waveform within normal limits, carotid upstroke brisk and symmetric, no bruits, no thyromegaly LYMPHATICS:  No cervical, inguinal adenopathy LUNGS:  Clear to auscultation bilaterally BACK:  No CVA tenderness CHEST:  Unremarkable HEART:  PMI not displaced or sustained,S1 and S2 within normal limits, no S3, no S4,  no clicks, no rubs, no murmurs ABD:  Flat, positive bowel sounds normal in frequency in pitch, no bruits, no rebound, no guarding, no midline pulsatile mass, no hepatomegaly, no splenomegaly EXT:  2 plus pulses throughout, no edema, no cyanosis no clubbing SKIN:  No rashes no nodules  EKG:   Sinus rhythm, rate 85, left axis deviation, no acute ST-T wave changes. 01/18/2014  ASSESSMENT AND PLAN  CAD:  The patient has no new sypmtoms.  No further cardiovascular testing is indicated.  We will continue with aggressive risk reduction and meds as listed.  He is at high risk for future cardiovascular events because he cannot adherent any lifestyle changes. He understands this.  HTN:  I will increase his Norvasc to 10 mg daily. I'm trying to convince him  to buy a blood pressure cuff to keep a record. He has when necessary clonidine the years but doesn't know when to use it because his not taking his blood pressure.  TOBACCO ABUSE:  We talked about this. He does not want any prescriptions or other over-the-counter medications  DYSLIPIDEMIA:  He did not tolerate statins.  He has been very sensitive to medications.  He will try diet control. He does agree to get his cholesterol checked and consider a statin once he has started Medicaid.

## 2014-01-18 NOTE — Patient Instructions (Signed)
Please increase your Amlodipine to 10 mg a day. Continue all other medications as listed.  Follow up in 2 months with Dr Antoine PocheHochrein.

## 2014-01-26 ENCOUNTER — Other Ambulatory Visit: Payer: Self-pay | Admitting: Cardiology

## 2014-01-26 NOTE — Telephone Encounter (Signed)
Spoke with pt who reports after taking the Amlodipine 10 mg he felt like his heart was fluttering and didn't feel well at all.  He reports that this really scared him and he has gone back to 5 mg a day.  Encouraged pt to obtain a BP monitor and take his BP once a day a few hours after taking his medications and keeping a list of it.  Reinforced with him the importance of getting his BP under better control because of the increased risks it puts his body at.  He states he understands and will look at the monitors and see about getting one.  I forward this information to Dr Antoine PocheHochrein for his knowledge.  Rx was sent into [facility] pharmacy for 5 mg a day since the pt reports he does not want to take the 10 mg again.

## 2014-01-26 NOTE — Telephone Encounter (Signed)
New message    Patient calling was told to double medicaton -  30 minutes later starting having a funny feeling. - amlodipine 10 mg .

## 2014-01-27 MED ORDER — AMLODIPINE BESYLATE 5 MG PO TABS: 5.0000 mg | ORAL_TABLET | Freq: Every day | ORAL | Status: DC

## 2014-03-22 ENCOUNTER — Encounter: Payer: Self-pay | Admitting: Cardiology

## 2014-03-22 ENCOUNTER — Ambulatory Visit (INDEPENDENT_AMBULATORY_CARE_PROVIDER_SITE_OTHER): Payer: MEDICARE | Admitting: Cardiology

## 2014-03-22 VITALS — BP 185/100 | HR 96 | Ht 67.0 in | Wt 175.0 lb

## 2014-03-22 DIAGNOSIS — I2589 Other forms of chronic ischemic heart disease: Secondary | ICD-10-CM

## 2014-03-22 DIAGNOSIS — I255 Ischemic cardiomyopathy: Secondary | ICD-10-CM

## 2014-03-22 DIAGNOSIS — I2109 ST elevation (STEMI) myocardial infarction involving other coronary artery of anterior wall: Secondary | ICD-10-CM

## 2014-03-22 DIAGNOSIS — I10 Essential (primary) hypertension: Secondary | ICD-10-CM

## 2014-03-22 DIAGNOSIS — I2510 Atherosclerotic heart disease of native coronary artery without angina pectoris: Secondary | ICD-10-CM

## 2014-03-22 MED ORDER — AMLODIPINE BESYLATE 5 MG PO TABS: 7.5000 mg | ORAL_TABLET | Freq: Every day | ORAL | Status: DC

## 2014-03-22 NOTE — Progress Notes (Signed)
HPI The patient presents for followup of an acute myocardial infarction earlier this year. He had 99% ostial LAD stenosis treated with a drug-eluting stent. Initially his ejection fraction was about 20% but improved to 55% on followup echo in June of 2014.  He has had difficult to control BP.  He continues to smoke cigarettes.   At a previous visit his blood pressure was elevated and he went home with when necessary clonidine but he didn't take this. At the last visit I tried to increase his amlodipine because of his blood pressure but he said he felt too tired with 10 mg and went back to the 5 mg. He's not taking his blood pressure readings routinely so I don't know what he is doing at home.  He doesn't report any cardiovascular complaints however.  The patient denies any new symptoms such as chest discomfort, neck or arm discomfort. There has been no new shortness of breath, PND or orthopnea. There have been no reported palpitations, presyncope or syncope. He's not exercising but he does some yard work such as [employer]. and has not limitations with this.  He does not have a primary care provider.  He continues to smoke cigarettes.   Allergies  Allergen Reactions  . Lipitor [Atorvastatin]     Lip swelling  . Lisinopril Swelling    Lip swelling    Current Outpatient Prescriptions  Medication Sig Dispense Refill  . amLODipine (NORVASC) 5 MG tablet Take 1 tablet (5 mg total) by mouth daily.  30 tablet  11  . aspirin EC 81 MG EC tablet Take 1 tablet (81 mg total) by mouth daily.  30 tablet  10  . cloNIDine (CATAPRES) 0.1 MG tablet Take 1 tablet (0.1 mg total) by mouth as needed.  30 tablet  3  . nitroGLYCERIN (NITROSTAT) 0.4 MG SL tablet Place 1 tablet (0.4 mg total) under the tongue every 5 (five) minutes x 3 doses as needed for chest pain.  30 tablet  12  . prasugrel (EFFIENT) 10 MG TABS tablet Take 1 tablet (10 mg total) by mouth daily.  14 tablet  0   No current facility-administered  medications for this visit.    Past Medical History  Diagnosis Date  . HTN (hypertension)   . Tobacco abuse     a. 1.5 ppd x 40 yrs  . Inguinal hernia     a. s/p repair x 2 on left  . CAD (coronary artery disease)     Acute anterior STEMI/emergent DES 99% ostial LAD, 11/2012  . Ischemic cardiomyopathy     Initial EF 30%, by cardiac catheterization; followup EF 55%, by echo  . HLD (hyperlipidemia)     Atorvastatin intolerant, secondary to angioedema of the lip  . Ejection fraction     EF 30%, at time of acute MI    //   EF 55%, echo, June, 2014, hypokinesis of the distal anteroseptal wall    Past Surgical History  Procedure Laterality Date  . Hernia repair      a. L inguinal x 2  . Coronary angioplasty with stent placement      ROS:  As stated in the HPI and negative for all other systems.  PHYSICAL EXAM BP 185/100  Pulse 96  Ht  (1.702 m)  Wt 175 lb (79.379 kg)  BMI 27.40 kg/m2 GENERAL:  Well appearing NECK:  No jugular venous distention, waveform within normal limits, carotid upstroke brisk and symmetric, no bruits, no thyromegal  LUNGS:  Clear to auscultation bilaterally CHEST:  Unremarkable HEART:  PMI not displaced or sustained,S1 and S2 within normal limits, no S3, no S4, no clicks, no rubs, no murmurs ABD:  Flat, positive bowel sounds normal in frequency in pitch, no bruits, no rebound, no guarding, no midline pulsatile mass, no hepatomegaly, no splenomegaly EXT:  2 plus pulses throughout, no edema, no cyanosis no clubbing   ASSESSMENT AND PLAN  CAD:  The patient has no new sypmtoms.  No further cardiovascular testing is indicated.  We will continue with aggressive risk reduction and meds as listed.  He is at high risk for future cardiovascular events because he cannot adherent any lifestyle changes. He understands this.  HTN:  He agrees to try Norvasc 7.5 mg.  He understands the risk of stroke because his not taking his blood pressure routinely and will use  the clonidine as needed because his not taking his blood pressure readings.  TOBACCO ABUSE:  We talked about this. He does not want any prescriptions or other over-the-counter medications.  He says that he cannot stop smoking  DYSLIPIDEMIA:  He did not tolerate statins.  He has been very sensitive to medications.  He will try diet control. He does agree to get his cholesterol checked and consider a statin once he has started Medicaid.  However, there has been no progress in this.

## 2014-03-22 NOTE — Patient Instructions (Signed)
Please increase your Amlodipine to 7.5 mg a day (1 and 1/2 tablets daily) Continue all other medications as listed.  Follow up in 6 months with Dr. Antoine Poche.  You will receive a letter in the mail 2 months before you are due.  Please call us when you receive this letter to schedule your follow up appointment.

## 2014-05-05 ENCOUNTER — Telehealth: Payer: Self-pay | Admitting: Cardiology

## 2014-05-05 NOTE — Telephone Encounter (Signed)
New message     Did we get clearance to hold plavix prior to colonoscopy--11-18?

## 2014-05-09 ENCOUNTER — Telehealth: Payer: Self-pay | Admitting: *Deleted

## 2014-05-09 NOTE — Telephone Encounter (Signed)
Patient is needing clearance for colonoscopy and okay to hold plavix 4 days prior to the procedure. The procedure is scheduled for 05-24-14. Will forward for dr hochrein review

## 2014-05-10 NOTE — Telephone Encounter (Signed)
Another telephone encounter was created r/t to this same question. Has been deferred to Dr. Antoine PocheHochrein to advise. This encounter will be closed.

## 2014-05-11 NOTE — Telephone Encounter (Signed)
Mardella LaymanLindsey is calling to see if the fax was received in regards to pt needing to hold his plavix so that he can have his colonoscopy on 05/24/14. Please call  thanks

## 2014-05-11 NOTE — Telephone Encounter (Signed)
Left message for lindsey, we have the fax and are waiting for an answer from dr hochrein

## 2014-05-16 NOTE — Telephone Encounter (Signed)
Kyle Finley is calling back to get a response from Dr.Hochrein . Please call  thanks

## 2014-05-16 NOTE — Telephone Encounter (Signed)
Forward to Dr Hochrein/Cheryl lpn

## 2014-05-17 ENCOUNTER — Other Ambulatory Visit: Payer: Self-pay | Admitting: *Deleted

## 2014-05-17 DIAGNOSIS — I2109 ST elevation (STEMI) myocardial infarction involving other coronary artery of anterior wall: Secondary | ICD-10-CM

## 2014-05-17 MED ORDER — PRASUGREL HCL 10 MG PO TABS: 10.0000 mg | ORAL_TABLET | Freq: Every day | ORAL | Status: DC

## 2014-05-17 NOTE — Telephone Encounter (Signed)
Spoke with Mardella LaymanLindsey @ [facility] - patient can HOLD plavix starting today, per documentation below, but should continue ASA.

## 2014-05-17 NOTE — Telephone Encounter (Signed)
Pt is on Effient and having a colonoscopy 11/18.  He is needing clearance and approval to hold the Effient.

## 2014-05-17 NOTE — Telephone Encounter (Signed)
He can hold his Effient starting today for the procedure as necessary.  He should continue ASA.

## 2014-05-17 NOTE — Telephone Encounter (Signed)
Addendum: Patient takes Effient - not plavix.  Communicated this to TownsendLindsey as well

## 2014-05-29 ENCOUNTER — Telehealth: Payer: Self-pay

## 2014-06-05 ENCOUNTER — Other Ambulatory Visit: Payer: Self-pay | Admitting: *Deleted

## 2014-06-05 MED ORDER — PRASUGREL HCL 10 MG PO TABS: 10.0000 mg | ORAL_TABLET | Freq: Every day | ORAL | Status: DC

## 2014-06-15 ENCOUNTER — Encounter (HOSPITAL_COMMUNITY): Payer: Self-pay | Admitting: Cardiovascular Disease

## 2014-09-20 ENCOUNTER — Ambulatory Visit (INDEPENDENT_AMBULATORY_CARE_PROVIDER_SITE_OTHER): Payer: Medicare Other | Admitting: Cardiology

## 2014-09-20 ENCOUNTER — Encounter: Payer: Self-pay | Admitting: Cardiology

## 2014-09-20 VITALS — BP 156/80 | HR 92 | Ht 67.0 in | Wt 178.0 lb

## 2014-09-20 DIAGNOSIS — I10 Essential (primary) hypertension: Secondary | ICD-10-CM | POA: Diagnosis not present

## 2014-09-20 MED ORDER — CHLORTHALIDONE 25 MG PO TABS: 25.0000 mg | ORAL_TABLET | Freq: Every day | ORAL | Status: DC

## 2014-09-20 NOTE — Progress Notes (Signed)
HPI The patient presents for followup of CAD,   He had 99% ostial LAD stenosis treated with a drug-eluting stent. Initially his ejection fraction was about 20% but improved to 55% on followup echo in June of 2014.  He has had difficult to control BP.  He continues to smoke cigarettes.   He has in the past been advised to take clonidine needed for his blood pressure. He had done this. He hasn't tolerated higher doses of Norvasc. He is doing yard work.  The patient denies any new symptoms such as chest discomfort, neck or arm discomfort. There has been no new shortness of breath, PND or orthopnea. There have been no reported palpitations, presyncope or syncope.       Allergies  Allergen Reactions  . Lipitor [Atorvastatin]     Lip swelling  . Lisinopril Swelling    Lip swelling    Current Outpatient Prescriptions  Medication Sig Dispense Refill  . amLODipine (NORVASC) 5 MG tablet Take 1.5 tablets (7.5 mg total) by mouth daily. 45 tablet 11  . aspirin EC 81 MG EC tablet Take 1 tablet (81 mg total) by mouth daily. 30 tablet 10  . nitroGLYCERIN (NITROSTAT) 0.4 MG SL tablet Place 1 tablet (0.4 mg total) under the tongue every 5 (five) minutes x 3 doses as needed for chest pain. 30 tablet 12  . prasugrel (EFFIENT) 10 MG TABS tablet Take 1 tablet (10 mg total) by mouth daily. 30 tablet 3  . cloNIDine (CATAPRES) 0.1 MG tablet Take 1 tablet (0.1 mg total) by mouth as needed. (Patient not taking: Reported on 09/20/2014) 30 tablet 3   No current facility-administered medications for this visit.    Past Medical History  Diagnosis Date  . HTN (hypertension)   . Tobacco abuse     a. 1.5 ppd x 40 yrs  . Inguinal hernia     a. s/p repair x 2 on left  . CAD (coronary artery disease)     Acute anterior STEMI/emergent DES 99% ostial LAD, 11/2012  . Ischemic cardiomyopathy     Initial EF 30%, by cardiac catheterization; followup EF 55%, by echo  . HLD (hyperlipidemia)     Atorvastatin intolerant,  secondary to angioedema of the lip  . Ejection fraction     EF 30%, at time of acute MI    //   EF 55%, echo, June, 2014, hypokinesis of the distal anteroseptal wall    Past Surgical History  Procedure Laterality Date  . Hernia repair      a. L inguinal x 2  . Coronary angioplasty with stent placement    . Left heart catheterization with coronary angiogram N/A 11/24/2012    Procedure: LEFT HEART CATHETERIZATION WITH CORONARY ANGIOGRAM;  Surgeon: Kathleene Hazelhristopher D McAlhany, MD;  [facility];  Service: Cardiovascular;  Laterality: N/A;  . Percutaneous coronary stent intervention (pci-s)  11/24/2012    Procedure: PERCUTANEOUS CORONARY STENT INTERVENTION (PCI-S);  Surgeon: Kathleene Hazelhristopher D McAlhany, MD;  [facility];  Service: Cardiovascular;;    ROS:  As stated in the HPI and negative for all other systems.  PHYSICAL EXAM BP 156/80 mmHg  Pulse 92  Ht 5\' 7"  (1.702 m)  Wt 178 lb (80.74 kg)  BMI 27.87 kg/m2 GENERAL:  Well appearing NECK:  No jugular venous distention, waveform within normal limits, carotid upstroke brisk and symmetric, no bruits, no thyromegaly LUNGS:  Clear to auscultation bilaterally CHEST:  Unremarkable HEART:  PMI not displaced or sustained,S1 and  S2 within normal limits, no S3, no S4, no clicks, no rubs, no murmurs ABD:  Flat, positive bowel sounds normal in frequency in pitch, no bruits, no rebound, no guarding, no midline pulsatile mass, no hepatomegaly, no splenomegaly EXT:  2 plus pulses throughout, no edema, no cyanosis no clubbing  EKG: Sinus rhythm, rate 92, axis within normal limits, intervals within normal limits, no acute ST-T wave changes.  09/20/2014   ASSESSMENT AND PLAN  CAD:  The patient has no new sypmtoms.  No further cardiovascular testing is indicated.  We will continue with aggressive risk reduction and meds as listed.  He is at high risk for future cardiovascular events because he cannot adherent any lifestyle changes. He understands  this.  HTN:  He will start chlorthalidone 25 mg daily.  He will increase his potassium containing foods.  We can check BMET in two weeks.  TOBACCO ABUSE:  We talked about this. He does not want any prescriptions or other over-the-counter medications.  He says that he cannot stop smoking  DYSLIPIDEMIA:  He is now on a statin.  We will need to try to update his med list.  I will defer to his PCP.

## 2014-09-20 NOTE — Patient Instructions (Addendum)
Please start Chlorthalidone 25 mg once a day. Continue all other medications as listed.  Please increase food high in potassium in your diet.  (bananas, citrus fruits/juices, potatoes, tomatoes, dried fruits/nuts, green leafy vegetables)  Please have blood work in 2 weeks. (BMP) order written and given to pt)  Follow up in 6 months with Dr. Antoine [place]Hochrein in the [place] office.  You will receive a letter in the mail 2 months before you are due.  Please call us when you receive this letter to schedule your follow up appointment.  Thank you for choosing [facility]!!

## 2014-10-03 ENCOUNTER — Other Ambulatory Visit: Payer: Self-pay | Admitting: Cardiology

## 2014-10-03 NOTE — Telephone Encounter (Signed)
Rx refill sent to patient pharmacy   

## 2015-04-14 ENCOUNTER — Other Ambulatory Visit: Payer: Self-pay | Admitting: Cardiology

## 2015-04-16 ENCOUNTER — Other Ambulatory Visit: Payer: Self-pay

## 2015-04-16 MED ORDER — AMLODIPINE BESYLATE 5 MG PO TABS: 7.5000 mg | ORAL_TABLET | Freq: Every day | ORAL | Status: DC

## 2015-05-11 ENCOUNTER — Other Ambulatory Visit: Payer: Self-pay | Admitting: Cardiology

## 2015-06-20 ENCOUNTER — Encounter: Payer: Self-pay | Admitting: Cardiology

## 2015-06-20 ENCOUNTER — Ambulatory Visit (INDEPENDENT_AMBULATORY_CARE_PROVIDER_SITE_OTHER): Payer: Medicare Other | Admitting: Cardiology

## 2015-06-20 VITALS — BP 142/90 | HR 71 | Ht 67.0 in | Wt 176.0 lb

## 2015-06-20 DIAGNOSIS — I2510 Atherosclerotic heart disease of native coronary artery without angina pectoris: Secondary | ICD-10-CM | POA: Diagnosis not present

## 2015-06-20 DIAGNOSIS — I10 Essential (primary) hypertension: Secondary | ICD-10-CM | POA: Diagnosis not present

## 2015-06-20 NOTE — Progress Notes (Signed)
HPI The patient presents for followup of CAD,   He had 99% ostial LAD stenosis treated with a drug-eluting stent in 2014. Initially his ejection fraction was about 20% but improved to 55% on followup echo in June of 2014.  He has had difficult to control BP.  He continues to smoke cigarettes.  At the last visit I added chlorthalidone because regimen. He said his blood pressures been well controlled. He has not had to take her clonidine area and he has not had to use any nitroglycerin. He denies any of the symptoms that he's previously had with his angina.  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  The patient denies any new symptoms such as chest discomfort, neck or arm discomfort. There has been no new shortness of breath, PND or orthopnea. There have been no reported palpitations, presyncope or syncope.     Allergies  Allergen Reactions  . Lisinopril Swelling    Lip swelling    Current Outpatient Prescriptions  Medication Sig Dispense Refill  . amLODipine (NORVASC) 5 MG tablet TAKE 1.5 TABLETS (7.5 MG TOTAL) BY MOUTH DAILY. 45 tablet 0  . aspirin EC 81 MG EC tablet Take 1 tablet (81 mg total) by mouth daily. 30 tablet 10  . atorvastatin (LIPITOR) 80 MG tablet TAKE ONE TABLET BY MOUTH ONE TIME DAILY    . chlorthalidone (HYGROTON) 25 MG tablet Take 1 tablet (25 mg total) by mouth daily. 30 tablet 11  . EFFIENT 10 MG TABS tablet TAKE ONE TABLET BY MOUTH ONE TIME DAILY 30 tablet 11  . ezetimibe (ZETIA) 10 MG tablet TAKE ONE TABLET BY MOUTH ONE TIME DAILY    . metoprolol succinate (TOPROL-XL) 25 MG 24 hr tablet TAKE ONE TABLET BY MOUTH ONE TIME DAILY    . cloNIDine (CATAPRES) 0.1 MG tablet Take 1 tablet (0.1 mg total) by mouth as needed. (Patient not taking: Reported on 09/20/2014) 30 tablet 3  . nitroGLYCERIN (NITROSTAT) 0.4 MG SL tablet Place 1 tablet (0.4 mg total) under the tongue every 5 (five) minutes x 3 doses as needed for chest pain. 30 tablet 12   No current facility-administered  medications for this visit.    Past Medical History  Diagnosis Date  . HTN (hypertension)   . Tobacco abuse     a. 1.5 ppd x 40 yrs  . Inguinal hernia     a. s/p repair x 2 on left  . CAD (coronary artery disease)     Acute anterior STEMI/emergent DES 99% ostial LAD, 11/2012  . Ischemic cardiomyopathy     Initial EF 30%, by cardiac catheterization; followup EF 55%, by echo  . HLD (hyperlipidemia)     Atorvastatin intolerant, secondary to angioedema of the lip  . Ejection fraction     EF 30%, at time of acute MI    //   EF 55%, echo, June, 2014, hypokinesis of the distal anteroseptal wall    Past Surgical History  Procedure Laterality Date  . Hernia repair      a. L inguinal x 2  . Coronary angioplasty with stent placement    . Left heart catheterization with coronary angiogram N/A 11/24/2012    Procedure: LEFT HEART CATHETERIZATION WITH CORONARY ANGIOGRAM;  Surgeon: Kathleene Hazelhristopher D McAlhany, MD;  [facility];  Service: Cardiovascular;  Laterality: N/A;  . Percutaneous coronary stent intervention (pci-s)  11/24/2012    Procedure: PERCUTANEOUS CORONARY STENT INTERVENTION (PCI-S);  Surgeon: Kathleene Hazelhristopher D McAlhany, MD;  [facility];  Service: Cardiovascular;;    ROS:  As stated in the HPI and negative for all other systems.  PHYSICAL EXAM BP 142/90 mmHg  Pulse 71  Ht  (1.702 m)  Wt 176 lb (79.833 kg)  BMI 27.56 kg/m2 GENERAL:  Well appearing NECK:  No jugular venous distention, waveform within normal limits, carotid upstroke brisk and symmetric, no bruits, no thyromegaly LUNGS:  Clear to auscultation bilaterally CHEST:  Unremarkable HEART:  PMI not displaced or sustained,S1 and S2 within normal limits, no S3, no S4, no clicks, no rubs, no murmurs ABD:  Flat, positive bowel sounds normal in frequency in pitch, no bruits, no rebound, no guarding, no midline pulsatile mass, no hepatomegaly, no splenomegaly EXT:  2 plus pulses throughout, no edema, no cyanosis  no clubbing  EKG: Sinus rhythm, rate 71, axis within normal limits, intervals within normal limits, no acute ST-T wave changes.  06/20/2015   ASSESSMENT AND PLAN  CAD:  The patient has no new sypmtoms.  No further cardiovascular testing is indicated.  He can stop the Effient  HTN:  The blood pressure is at target. No change in medications is indicated. We will continue with therapeutic lifestyle changes (TLC).  TOBACCO ABUSE:  We talked about this. He does not want any prescriptions or other over-the-counter medications.  He says that he cannot stop smoking and he does not want to  DYSLIPIDEMIA:  He says this is followed by Verl Bangs, MD.  The goal should be an LDL less than 70.

## 2015-06-20 NOTE — Patient Instructions (Signed)
Medication Instructions:  You may stop your Effient. Continue all other medications as listed.  Follow-Up: Follow up in 1 year with Dr. Antoine PocheHochrein.  You will receive a letter in the mail 2 months before you are due.  Please call us when you receive this letter to schedule your follow up appointment.  If you need a refill on your cardiac medications before your next appointment, please call your pharmacy.  Thank you for choosing [facility]!!

## 2015-06-26 ENCOUNTER — Other Ambulatory Visit: Payer: Self-pay | Admitting: Cardiology

## 2015-06-26 ENCOUNTER — Other Ambulatory Visit: Payer: Self-pay

## 2015-06-26 DIAGNOSIS — I10 Essential (primary) hypertension: Secondary | ICD-10-CM

## 2015-06-26 MED ORDER — AMLODIPINE BESYLATE 5 MG PO TABS: ORAL_TABLET | ORAL | Status: DC

## 2015-06-26 MED ORDER — CHLORTHALIDONE 25 MG PO TABS: 25.0000 mg | ORAL_TABLET | Freq: Every day | ORAL | Status: DC

## 2015-06-26 NOTE — Telephone Encounter (Signed)
Pt calling requesting refills. Please advise

## 2018-04-19 NOTE — Progress Notes (Signed)
HPI The patient presents for followup of CAD,   He had 99% ostial LAD stenosis treated with a drug-eluting stent in 2014. Initially his ejection fraction was about 20% but improved to 55% on followup echo in June of 2014.  He has had difficult to control BP.  I have not seen him since Dec 2016.    He returns for follow-up.  He thought it had been quite a while since he was seen and he wanted to be seen.  He does have some shortness of breath and he was referred to [facility].  I looked at these records.  It looks like he was referred mostly because he had some swelling in his abdomen and "private parts."  And it was thought that he might be having allergy.  I do not know what testing was done.  He has not had any leg swelling, PND or orthopnea.  He has not noticed any palpitations, presyncope or syncope.  Denies any chest discomfort, neck or arm discomfort.  He does have shortness of breath walking up an incline from feeding his dogs.  Just about 20 yards.  This is been slowly progressive.  He still smokes cigarettes and he enjoys  Allergies  Allergen Reactions  . Lisinopril Swelling    Lip swelling    Current Outpatient Medications  Medication Sig Dispense Refill  . aspirin EC 81 MG EC tablet Take 1 tablet (81 mg total) by mouth daily. 30 tablet 10  . atorvastatin (LIPITOR) 80 MG tablet TAKE ONE TABLET BY MOUTH ONE TIME DAILY    . chlorthalidone (HYGROTON) 50 MG tablet Take 50 mg by mouth daily.  1  . diphenhydrAMINE (BENADRYL) 25 MG tablet Take 25 mg by mouth as needed.     . ezetimibe (ZETIA) 10 MG tablet TAKE ONE TABLET BY MOUTH ONE TIME DAILY    . KLOR-CON M20 20 MEQ tablet TAKE ONE TABLET (20 MEQ TOTAL) BY MOUTH DAILY.  1  . metoprolol succinate (TOPROL-XL) 100 MG 24 hr tablet Take 100 mg by mouth daily.  0  . nitroGLYCERIN (NITROSTAT) 0.4 MG SL tablet Place 1 tablet (0.4 mg total) under the tongue every 5 (five) minutes x 3 doses as needed for chest pain. 30 tablet 12  . amLODipine  (NORVASC) 2.5 MG tablet Take 1 tablet (2.5 mg total) by mouth daily. 90 tablet 3   No current facility-administered medications for this visit.     Past Medical History:  Diagnosis Date  . CAD (coronary artery disease)    Acute anterior STEMI/emergent DES 99% ostial LAD, 11/2012  . Ejection fraction    EF 30%, at time of acute MI    //   EF 55%, echo, June, 2014, hypokinesis of the distal anteroseptal wall  . HLD (hyperlipidemia)    Atorvastatin intolerant, secondary to angioedema of the lip  . HTN (hypertension)   . Inguinal hernia    a. s/p repair x 2 on left  . Ischemic cardiomyopathy    Initial EF 30%, by cardiac catheterization; followup EF 55%, by echo  . Tobacco abuse    a. 1.5 ppd x 40 yrs    Past Surgical History:  Procedure Laterality Date  . CORONARY ANGIOPLASTY WITH STENT PLACEMENT    . Hernia Repair     a. L inguinal x 2  . LEFT HEART CATHETERIZATION WITH CORONARY ANGIOGRAM N/A 11/24/2012   Procedure: LEFT HEART CATHETERIZATION WITH CORONARY ANGIOGRAM;  Surgeon: Kathleene Hazel, MD;  Location: [facility]  CATH LAB;  Service: Cardiovascular;  Laterality: N/A;  . PERCUTANEOUS CORONARY STENT INTERVENTION (PCI-S)  11/24/2012   Procedure: PERCUTANEOUS CORONARY STENT INTERVENTION (PCI-S);  Surgeon: Kathleene Hazel, MD;  Location: [facility] CATH LAB;  Service: Cardiovascular;;    ROS: As stated in the HPI and negative for all other systems.  PHYSICAL EXAM BP (!) 160/98   Pulse 71   Ht 5\' 7"  (1.702 m)   Wt 177 lb (80.3 kg)   BMI 27.72 kg/m  GENERAL:  Well appearing NECK:  No jugular venous distention, waveform within normal limits, carotid upstroke brisk and symmetric, no bruits, no thyromegaly LUNGS:  Clear to auscultation bilaterally CHEST:  Unremarkable HEART:  PMI not displaced or sustained,S1 and S2 within normal limits, no S3, no S4, no clicks, no rubs, no murmurs ABD:  Flat, positive bowel sounds normal in frequency in pitch, no bruits, no rebound, no guarding,  no midline pulsatile mass, no hepatomegaly, no splenomegaly EXT:  2 plus pulses throughout, no edema, no cyanosis no clubbing  EKG: Sinus rhythm, rate 71, axis within normal limits, intervals within normal limits, no acute ST-T wave changes.  04/21/2018   ASSESSMENT AND PLAN  CAD:    I do not think he is having any anginal symptoms.  I suspect his shortness of breath is related to his cigarettes.  However, I would like to screen him with a POET (Plain Old Exercise Treadmill).  I suspect we will be able to get his heart rate to target because of that and keep him on beta-blockers with his hypertension but we will at least try to see if there are any high risk findings on possibly a submaximal test.   HTN:  The blood pressure is not well controlled.  I am going to add Norvasc 2.5 mg daily.   TOBACCO ABUSE:   He has no intention on quitting smoking because he enjoys it.   DYSLIPIDEMIA:   LDL was 52 and HDL 50 in July.  He will continue the meds as listed.  DYSPNEA: I will check a BNP level.

## 2018-04-21 ENCOUNTER — Ambulatory Visit (INDEPENDENT_AMBULATORY_CARE_PROVIDER_SITE_OTHER): Payer: Medicare Other | Admitting: Cardiology

## 2018-04-21 ENCOUNTER — Encounter: Payer: Self-pay | Admitting: Cardiology

## 2018-04-21 VITALS — BP 160/98 | HR 71 | Ht 67.0 in | Wt 177.0 lb

## 2018-04-21 DIAGNOSIS — I10 Essential (primary) hypertension: Secondary | ICD-10-CM | POA: Diagnosis not present

## 2018-04-21 DIAGNOSIS — E785 Hyperlipidemia, unspecified: Secondary | ICD-10-CM

## 2018-04-21 DIAGNOSIS — R0602 Shortness of breath: Secondary | ICD-10-CM | POA: Diagnosis not present

## 2018-04-21 DIAGNOSIS — I2510 Atherosclerotic heart disease of native coronary artery without angina pectoris: Secondary | ICD-10-CM | POA: Diagnosis not present

## 2018-04-21 MED ORDER — AMLODIPINE BESYLATE 2.5 MG PO TABS: 2.5000 mg | ORAL_TABLET | Freq: Every day | ORAL | 3 refills | Status: AC

## 2018-04-21 NOTE — Patient Instructions (Signed)
Medication Instructions:  Please start Amlodipine 2.5 mg daily.  Continue all other medications as listed.  If you need a refill on your cardiac medications before your next appointment, please call your pharmacy.   Lab work: Please have blood work at our office when you have you treadmill test.  You do not need to be fasting for it. (pro-BNP)  Testing/Procedures: Your physician has requested that you have an exercise tolerance test. For further information please visit [email or web address] Please also follow instruction sheet, as given.  Follow-Up: Follow up in 1 year with Dr. Antoine Poche in [place].  You will receive a letter in the mail 2 months before you are due.  Please call us when you receive this letter to schedule your follow up appointment.  Thank you for choosing [facility]!!

## 2018-04-27 ENCOUNTER — Telehealth (HOSPITAL_COMMUNITY): Payer: Self-pay

## 2018-04-27 NOTE — Telephone Encounter (Signed)
Encounter complete. 

## 2018-04-28 ENCOUNTER — Telehealth (HOSPITAL_COMMUNITY): Payer: Self-pay

## 2018-04-28 NOTE — Telephone Encounter (Signed)
Encounter complete. 

## 2018-04-29 ENCOUNTER — Ambulatory Visit (HOSPITAL_COMMUNITY)
Admission: RE | Admit: 2018-04-29 | Payer: Medicare Other | Source: Ambulatory Visit | Attending: Cardiology | Admitting: Cardiology

## 2018-05-05 ENCOUNTER — Ambulatory Visit (HOSPITAL_COMMUNITY)
Admission: RE | Admit: 2018-05-05 | Discharge: 2018-05-05 | Disposition: A | Discharge status: Home / Self Care | Payer: Medicare Other | Source: Ambulatory Visit | Attending: Cardiology | Admitting: Cardiology

## 2018-05-05 DIAGNOSIS — I10 Essential (primary) hypertension: Secondary | ICD-10-CM | POA: Insufficient documentation

## 2018-05-05 DIAGNOSIS — R0602 Shortness of breath: Secondary | ICD-10-CM

## 2018-05-05 DIAGNOSIS — I2510 Atherosclerotic heart disease of native coronary artery without angina pectoris: Secondary | ICD-10-CM | POA: Diagnosis present

## 2018-05-05 LAB — EXERCISE TOLERANCE TEST
Estimated workload: 8.2 METS
Exercise duration (min): 6 min
Exercise duration (sec): 50 s
MPHR: 151 {beats}/min
Peak HR: 131 {beats}/min
Percent HR: 86 %
RPE: 18
Rest HR: 70 {beats}/min

## 2018-05-12 ENCOUNTER — Encounter (HOSPITAL_COMMUNITY): Payer: Medicare Other

## 2018-05-14 ENCOUNTER — Telehealth: Payer: Self-pay | Admitting: *Deleted

## 2018-05-14 NOTE — Telephone Encounter (Signed)
Notes recorded by Rollene Rotunda, MD on 05/05/2018 at 1:02 PM EDT No evidence of ischemia. Awaiting BNP level. Call Mr. Bognar with the results and send results to Radiontchenko, Roxy Manns, MD  Attempted to contact pt to review results.  NA at home number and voicemail has not been set up at this time.  Will continue to attempt to call.

## 2018-05-17 NOTE — Telephone Encounter (Signed)
NA and voicemail has not been set up yet.  Will attempt again to contact to review results and also ask him to have blood work as ordered (BNP)

## 2018-05-20 NOTE — Telephone Encounter (Signed)
Finally was able to contact patient and review results with him.  He has not had his lab work ordered by Dr Antoine PocheHochrein drawn as of yet (pro-BNP)  It is ordered in Epic and he would like to go to his PCP to have it drawn.   Pt will check there to see if it can be drawn there so [facility] [place] [facility] [place] [facility]  I will forward this information to his PCP.

## 2019-06-07 DIAGNOSIS — R0602 Shortness of breath: Secondary | ICD-10-CM | POA: Insufficient documentation

## 2019-06-07 DIAGNOSIS — E785 Hyperlipidemia, unspecified: Secondary | ICD-10-CM | POA: Insufficient documentation

## 2019-06-07 DIAGNOSIS — Z7189 Other specified counseling: Secondary | ICD-10-CM | POA: Insufficient documentation

## 2019-06-07 NOTE — Progress Notes (Signed)
HPI The patient presents for followup of CAD,   He had 99% ostial LAD stenosis treated with a drug-eluting stent in 2014. Initially his ejection fraction was about 20% but improved to 55% on followup echo in June of 2014.  He has had difficult to control BP.   Last year I sent him for a screening POET (Plain Old Exercise Treadmill) which was negative.     Since I last saw him he has done well.  He still walks 20 yards up an incline from feeding his dogs.  However, he is not getting short of breath like he did previously. The patient denies any new symptoms such as chest discomfort, neck or arm discomfort. There has been no new shortness of breath, PND or orthopnea. There have been no reported palpitations, presyncope or syncope.     Allergies  Allergen Reactions  . Lisinopril Swelling    Lip swelling    Current Outpatient Medications  Medication Sig Dispense Refill  . amLODipine (NORVASC) 2.5 MG tablet Take 1 tablet (2.5 mg total) by mouth daily. 90 tablet 3  . aspirin EC 81 MG EC tablet Take 1 tablet (81 mg total) by mouth daily. 30 tablet 10  . atorvastatin (LIPITOR) 80 MG tablet TAKE ONE TABLET BY MOUTH ONE TIME DAILY    . chlorthalidone (HYGROTON) 50 MG tablet Take 50 mg by mouth daily.  1  . diphenhydrAMINE (BENADRYL) 25 MG tablet Take 25 mg by mouth as needed.     . ezetimibe (ZETIA) 10 MG tablet TAKE ONE TABLET BY MOUTH ONE TIME DAILY    . KLOR-CON M20 20 MEQ tablet TAKE ONE TABLET (20 MEQ TOTAL) BY MOUTH DAILY.  1  . metoprolol succinate (TOPROL-XL) 100 MG 24 hr tablet Take 100 mg by mouth daily.  0  . nitroGLYCERIN (NITROSTAT) 0.4 MG SL tablet Place 1 tablet (0.4 mg total) under the tongue every 5 (five) minutes x 3 doses as needed for chest pain. 30 tablet 12   No current facility-administered medications for this visit.     Past Medical History:  Diagnosis Date  . CAD (coronary artery disease)    Acute anterior STEMI/emergent DES 99% ostial LAD, 11/2012  . Ejection  fraction    EF 30%, at time of acute MI    //   EF 55%, echo, June, 2014, hypokinesis of the distal anteroseptal wall  . HLD (hyperlipidemia)    Atorvastatin intolerant, secondary to angioedema of the lip  . HTN (hypertension)   . Inguinal hernia    a. s/p repair x 2 on left  . Ischemic cardiomyopathy    Initial EF 30%, by cardiac catheterization; followup EF 55%, by echo  . Tobacco abuse    a. 1.5 ppd x 40 yrs    Past Surgical History:  Procedure Laterality Date  . CORONARY ANGIOPLASTY WITH STENT PLACEMENT    . Hernia Repair     a. L inguinal x 2  . LEFT HEART CATHETERIZATION WITH CORONARY ANGIOGRAM N/A 11/24/2012   Procedure: LEFT HEART CATHETERIZATION WITH CORONARY ANGIOGRAM;  Surgeon: Kathleene Hazel, MD;  Location: [facility] CATH LAB;  Service: Cardiovascular;  Laterality: N/A;  . PERCUTANEOUS CORONARY STENT INTERVENTION (PCI-S)  11/24/2012   Procedure: PERCUTANEOUS CORONARY STENT INTERVENTION (PCI-S);  Surgeon: Kathleene Hazel, MD;  Location: [facility] CATH LAB;  Service: Cardiovascular;;    ROS: As stated in the HPI and negative for all other systems.  PHYSICAL EXAM BP 130/80   Pulse 69  Ht 5\' 7"  (1.702 m)   Wt 169 lb (76.7 kg)   BMI 26.47 kg/m  GENERAL:  Well appearing NECK:  No jugular venous distention, waveform within normal limits, carotid upstroke brisk and symmetric, no bruits, no thyromegaly LUNGS:  Clear to auscultation bilaterally CHEST:  Unremarkable HEART:  PMI not displaced or sustained,S1 and S2 within normal limits, no S3, no S4, no clicks, no rubs, no murmurs ABD:  Flat, positive bowel sounds normal in frequency in pitch, no bruits, no rebound, no guarding, no midline pulsatile mass, no hepatomegaly, no splenomegaly EXT:  2 plus pulses throughout, no edema, no cyanosis no clubbing    EKG: Sinus rhythm, rate 69, axis within normal limits, intervals within normal limits, no acute ST-T wave changes.  06/08/2019   ASSESSMENT AND PLAN  CAD:    Since his  POET (Plain Old Exercise Treadmill) he has had no change in symptoms.  No further testing is indicated.  We will continue with risk reduction.  HTN:  The blood pressure is   TOBACCO ABUSE:   He has no intention on quitting smoking because he enjoys it.   DYSLIPIDEMIA:   LDL was 76 with an HDL of 51.  No change in therapy.   DYSPNEA: He was to get a BNP level but I don't see that this was drawn.  However, he says he is no longer getting short of breath.  No change in therapy.

## 2019-06-08 ENCOUNTER — Other Ambulatory Visit: Payer: Self-pay

## 2019-06-08 ENCOUNTER — Encounter: Payer: Self-pay | Admitting: Cardiology

## 2019-06-08 ENCOUNTER — Ambulatory Visit (INDEPENDENT_AMBULATORY_CARE_PROVIDER_SITE_OTHER): Payer: Medicare Other | Admitting: Cardiology

## 2019-06-08 VITALS — BP 130/80 | HR 69 | Ht 67.0 in | Wt 169.0 lb

## 2019-06-08 DIAGNOSIS — I2510 Atherosclerotic heart disease of native coronary artery without angina pectoris: Secondary | ICD-10-CM

## 2019-06-08 DIAGNOSIS — R0602 Shortness of breath: Secondary | ICD-10-CM

## 2019-06-08 DIAGNOSIS — I10 Essential (primary) hypertension: Secondary | ICD-10-CM | POA: Diagnosis not present

## 2019-06-08 DIAGNOSIS — Z720 Tobacco use: Secondary | ICD-10-CM

## 2019-06-08 DIAGNOSIS — Z7189 Other specified counseling: Secondary | ICD-10-CM

## 2019-06-08 DIAGNOSIS — E785 Hyperlipidemia, unspecified: Secondary | ICD-10-CM

## 2019-06-08 NOTE — Patient Instructions (Signed)
Medication Instructions:  The current medical regimen is effective;  continue present plan and medications.  *If you need a refill on your cardiac medications before your next appointment, please call your pharmacy*  Follow-Up: At [facility], you and your health needs are our priority.  As part of our continuing mission to provide you with exceptional heart care, we have created designated Provider Care Teams.  These Care Teams include your primary Cardiologist (physician) and Advanced Practice Providers (APPs -  Physician Assistants and Nurse Practitioners) who all work together to provide you with the care you need, when you need it.  Your next appointment:   1 year(s)  The format for your next appointment:   In Person  Provider:   James Hochrein, MD  Thank you for choosing [facility]!!      

## 2021-07-08 NOTE — Progress Notes (Addendum)
Cardiology Office Note   Date:  07/10/2021   ID:  Jamieson, Hetland 1950-12-25, MRN [id number]  PCP:  Verl Bangs, MD  Cardiologist:   None   Chief Complaint  Patient presents with   Shortness of Breath      History of Present Illness: Kyle Finley is a 71 y.o. male who presents for ollowup of CAD,   He had 99% ostial LAD stenosis treated with a drug-eluting stent in 2014. Initially his ejection fraction was about 20% but improved to 55% on followup echo in June of 2014.  He has had difficult to control BP.   I last saw him in 2020.   He has been getting chest discomfort.  This has been sporadic and at rest.  Its midepigastric and he thinks it goes down to his stomach and he might go to the bathroom and feels better.  He does not think it is like his previous angina.  He is not getting any jaw or arm discomfort.  However, he does think he has had more shortness of breath with activity such as carrying heavy boxes.  He has a chronic cough and.  He is not having any PND orthopnea.  He is not having any palpitations.  However, he has episodes of feeling shaky and weak and nervous feeling.  He will have some tingling down his right arm.  He has had about 7 or 8 episodes this year or so.    Past Medical History:  Diagnosis Date   CAD (coronary artery disease)    Acute anterior STEMI/emergent DES 99% ostial LAD, 11/2012   Ejection fraction    EF 30%, at time of acute MI    //   EF 55%, echo, June, 2014, hypokinesis of the distal anteroseptal wall   HLD (hyperlipidemia)    Atorvastatin intolerant, secondary to angioedema of the lip   HTN (hypertension)    Inguinal hernia    a. s/p repair x 2 on left   Ischemic cardiomyopathy    Initial EF 30%, by cardiac catheterization; followup EF 55%, by echo   Tobacco abuse    a. 1.5 ppd x 40 yrs    Past Surgical History:  Procedure Laterality Date   CORONARY ANGIOPLASTY WITH STENT PLACEMENT     Hernia Repair     a. L  inguinal x 2   LEFT HEART CATHETERIZATION WITH CORONARY ANGIOGRAM N/A 11/24/2012   Procedure: LEFT HEART CATHETERIZATION WITH CORONARY ANGIOGRAM;  Surgeon: Kathleene Hazel, MD;  Location: [facility] CATH LAB;  Service: Cardiovascular;  Laterality: N/A;   PERCUTANEOUS CORONARY STENT INTERVENTION (PCI-S)  11/24/2012   Procedure: PERCUTANEOUS CORONARY STENT INTERVENTION (PCI-S);  Surgeon: Kathleene Hazel, MD;  Location: [facility] CATH LAB;  Service: Cardiovascular;;     Current Outpatient Medications  Medication Sig Dispense Refill   amLODipine (NORVASC) 2.5 MG tablet Take 1 tablet (2.5 mg total) by mouth daily. 90 tablet 3   aspirin EC 81 MG EC tablet Take 1 tablet (81 mg total) by mouth daily. 30 tablet 10   atorvastatin (LIPITOR) 80 MG tablet TAKE ONE TABLET BY MOUTH ONE TIME DAILY     chlorthalidone (HYGROTON) 50 MG tablet Take 50 mg by mouth daily.  1   diphenhydrAMINE (BENADRYL) 25 MG tablet Take 25 mg by mouth as needed.      ezetimibe (ZETIA) 10 MG tablet TAKE ONE TABLET BY MOUTH ONE TIME DAILY     KLOR-CON M20 20 MEQ tablet TAKE  ONE TABLET (20 MEQ TOTAL) BY MOUTH DAILY.  1   metoprolol succinate (TOPROL-XL) 100 MG 24 hr tablet Take 100 mg by mouth daily.  0   nitroGLYCERIN (NITROSTAT) 0.4 MG SL tablet Place 1 tablet (0.4 mg total) under the tongue every 5 (five) minutes x 3 doses as needed for chest pain. 30 tablet 12   No current facility-administered medications for this visit.    Allergies:   Lisinopril    ROS:  Please see the history of present illness.   Otherwise, review of systems are positive for none.   All other systems are reviewed and negative.    PHYSICAL EXAM: VS:  BP 128/72    Pulse 75    Ht 5\' 7"  (1.702 m)    Wt 166 lb 12.8 oz (75.7 kg)    SpO2 97%    BMI 26.12 kg/m  , BMI Body mass index is 26.12 kg/m. GENERAL:  Well appearing NECK:  No jugular venous distention, waveform within normal limits, carotid upstroke brisk and symmetric, possible right soft carotid  bruit bruits, no thyromegaly LUNGS: Decreased breath sounds diffuse expiratory wheezes CHEST:  Unremarkable HEART:  PMI not displaced or sustained,S1 and S2 within normal limits, no S3, no S4, no clicks, no rubs, no murmurs ABD:  Flat, positive bowel sounds normal in frequency in pitch, no bruits, no rebound, no guarding, no midline pulsatile mass, no hepatomegaly, no splenomegaly EXT:  2 plus pulses throughout, no edema, no cyanosis no clubbing   EKG:  EKG is ordered today. The ekg ordered today demonstrates sinus rhythm, rate 75, axis within normal limits, intervals within minutes, no acute ST-T wave changes.   Recent Labs: No results found for requested labs within last 8760 hours.    Lipid Panel    Component Value Date/Time   CHOL 288 (H) 02/18/2013 0845   TRIG 230.0 (H) 02/18/2013 0845   HDL 39.40 02/18/2013 0845   CHOLHDL 7 02/18/2013 0845   VLDL 46.0 (H) 02/18/2013 0845   LDLCALC 128 (H) 11/25/2012 0510   LDLDIRECT 210.1 02/18/2013 0845      Wt Readings from Last 3 Encounters:  07/10/21 166 lb 12.8 oz (75.7 kg)  06/08/19 169 lb (76.7 kg)  04/21/18 177 lb (80.3 kg)      Other studies Reviewed: Additional studies/ records that were reviewed today include: Labs. Review of the above records demonstrates:  Please see elsewhere in the note.     ASSESSMENT AND PLAN:  CAD:    The patient does have some chest discomfort that sounds nonanginal but he also has increasing shortness of breath.  To screen him with a previous stent he cannot have a CT.  We will therefore perform a stress test.  He would not be able to exercise adequately so he will have a pharmacologic perfusion study.   HTN:  The blood pressure is controlled.  No change in therapy.   TOBACCO ABUSE: He has no intention quitting smoking is a 04/23/18.    DYSLIPIDEMIA:   LDL was 67 recently.  HDL 50.  No change in therapy.  BRUIT:   The patient has dizziness and a possible right bruit.  I will get carotid  Dopplers.  AAA SCREENING: Given his age and smoking history and gender we will have screening ultrasound.   Current medicines are reviewed at length with the patient today.  The patient does not have concerns regarding medicines.  The following changes have been made:  no change  Labs/ tests  ordered today include:   Orders Placed This Encounter  Procedures   US Carotid Bilateral   US AORTA MEDICARE SCREENING   NM Myocar Multi W/Spect W/Wall Motion / EF   EKG 12-Lead     Disposition:   FU with me in 6 months or sooner if needed   Signed, Rollene RotundaJames Sarrah Fiorenza, MD  07/10/2021 2:11 PM    [facility]

## 2021-07-10 ENCOUNTER — Ambulatory Visit (INDEPENDENT_AMBULATORY_CARE_PROVIDER_SITE_OTHER): Payer: Medicare Other | Admitting: Cardiology

## 2021-07-10 ENCOUNTER — Encounter: Payer: Self-pay | Admitting: Cardiology

## 2021-07-10 ENCOUNTER — Other Ambulatory Visit: Payer: Self-pay

## 2021-07-10 ENCOUNTER — Encounter: Payer: Self-pay | Admitting: *Deleted

## 2021-07-10 VITALS — BP 128/72 | HR 75 | Ht 67.0 in | Wt 166.8 lb

## 2021-07-10 DIAGNOSIS — R0602 Shortness of breath: Secondary | ICD-10-CM

## 2021-07-10 DIAGNOSIS — I10 Essential (primary) hypertension: Secondary | ICD-10-CM

## 2021-07-10 DIAGNOSIS — I2510 Atherosclerotic heart disease of native coronary artery without angina pectoris: Secondary | ICD-10-CM

## 2021-07-10 DIAGNOSIS — R0989 Other specified symptoms and signs involving the circulatory and respiratory systems: Secondary | ICD-10-CM

## 2021-07-10 DIAGNOSIS — Z720 Tobacco use: Secondary | ICD-10-CM

## 2021-07-10 NOTE — Patient Instructions (Signed)
Medication Instructions:  The current medical regimen is effective;  continue present plan and medications.  *If you need a refill on your cardiac medications before your next appointment, please call your pharmacy*  Testing/Procedures: Your physician has requested that you have a carotid duplex. This test is an ultrasound of the carotid arteries in your neck. It looks at blood flow through these arteries that supply the brain with blood. Allow one hour for this exam. There are no restrictions or special instructions.  Your physician has requested that you have an abdominal aorta duplex. During this test, an ultrasound is used to evaluate the aorta. Allow 30 minutes for this exam. Do not eat after midnight the day before and avoid carbonated beverages  Your physician has requested that you have a lexiscan myoview. Please see separate instructions for this testing.  The above testing will be completed at [facility].  You will be contacted to be scheduled.  Follow-Up: At [facility], you and your health needs are our priority.  As part of our continuing mission to provide you with exceptional heart care, we have created designated Provider Care Teams.  These Care Teams include your primary Cardiologist (physician) and Advanced Practice Providers (APPs -  Physician Assistants and Nurse Practitioners) who all work together to provide you with the care you need, when you need it.  We recommend signing up for the patient portal called "MyChart".  Sign up information is provided on this After Visit Summary.  MyChart is used to connect with patients for Virtual Visits (Telemedicine).  Patients are able to view lab/test results, encounter notes, upcoming appointments, etc.  Non-urgent messages can be sent to your provider as well.   To learn more about what you can do with MyChart, go to [email or web address].    Your next appointment:   6 month(s)  The format for your next appointment:   In  Person  Provider:   Rollene Rotunda, MD    Thank you for choosing [facility]!!

## 2021-07-11 NOTE — Addendum Note (Signed)
Addended by: Sharin Grave on: 07/11/2021 09:10 AM   Modules accepted: Orders

## 2021-07-19 NOTE — Addendum Note (Signed)
Addended by: Rollene Rotunda on: 07/19/2021 07:38 AM   Modules accepted: Orders

## 2021-07-23 ENCOUNTER — Telehealth: Payer: Self-pay | Admitting: Cardiology

## 2021-07-23 NOTE — Telephone Encounter (Signed)
Checking percert on the following patient for testing scheduled at [facility].      LEXISCAN   08/05/2021

## 2021-07-30 ENCOUNTER — Other Ambulatory Visit: Payer: Self-pay

## 2021-07-30 ENCOUNTER — Ambulatory Visit (HOSPITAL_COMMUNITY)
Admission: RE | Admit: 2021-07-30 | Discharge: 2021-07-30 | Disposition: A | Discharge status: Home / Self Care | Payer: Medicare Other | Source: Ambulatory Visit | Attending: Cardiology | Admitting: Cardiology

## 2021-07-30 DIAGNOSIS — I10 Essential (primary) hypertension: Secondary | ICD-10-CM | POA: Diagnosis present

## 2021-07-30 DIAGNOSIS — I2510 Atherosclerotic heart disease of native coronary artery without angina pectoris: Secondary | ICD-10-CM | POA: Insufficient documentation

## 2021-07-30 DIAGNOSIS — Z87891 Personal history of nicotine dependence: Secondary | ICD-10-CM | POA: Insufficient documentation

## 2021-07-30 DIAGNOSIS — R0989 Other specified symptoms and signs involving the circulatory and respiratory systems: Secondary | ICD-10-CM

## 2021-07-30 DIAGNOSIS — Z720 Tobacco use: Secondary | ICD-10-CM

## 2021-07-30 DIAGNOSIS — Z136 Encounter for screening for cardiovascular disorders: Secondary | ICD-10-CM | POA: Diagnosis not present

## 2021-08-05 ENCOUNTER — Ambulatory Visit (HOSPITAL_COMMUNITY)
Admission: RE | Admit: 2021-08-05 | Discharge: 2021-08-05 | Disposition: A | Discharge status: Home / Self Care | Payer: Medicare Other | Source: Ambulatory Visit | Attending: Cardiology | Admitting: Cardiology

## 2021-08-05 ENCOUNTER — Encounter (HOSPITAL_COMMUNITY): Payer: Self-pay

## 2021-08-05 ENCOUNTER — Other Ambulatory Visit: Payer: Self-pay

## 2021-08-05 DIAGNOSIS — I2510 Atherosclerotic heart disease of native coronary artery without angina pectoris: Secondary | ICD-10-CM | POA: Insufficient documentation

## 2021-08-05 DIAGNOSIS — R0602 Shortness of breath: Secondary | ICD-10-CM | POA: Insufficient documentation

## 2021-08-05 LAB — NM MYOCAR MULTI W/SPECT W/WALL MOTION / EF
LV dias vol: 70 mL (ref 62–150)
LV sys vol: 17 mL
Nuc Stress EF: 75 %
Peak HR: 116 {beats}/min
RATE: 0.4
Rest HR: 74 {beats}/min
Rest Nuclear Isotope Dose: 10.3 mCi
SDS: 0
SRS: 6
SSS: 6
ST Depression (mm): 0 mm
Stress Nuclear Isotope Dose: 31 mCi
TID: 1.04

## 2021-08-05 MED ORDER — REGADENOSON 0.4 MG/5ML IV SOLN
INTRAVENOUS | Status: AC
  Administered 2021-08-05: 0.4 mg via INTRAVENOUS
  Filled 2021-08-05: qty 5

## 2021-08-05 MED ORDER — SODIUM CHLORIDE FLUSH 0.9 % IV SOLN
INTRAVENOUS | Status: AC
  Administered 2021-08-05: 10 mL via INTRAVENOUS
  Filled 2021-08-05: qty 10

## 2021-08-05 MED ORDER — TECHNETIUM TC 99M TETROFOSMIN IV KIT
30.0000 | PACK | Freq: Once | INTRAVENOUS | Status: AC | PRN
  Administered 2021-08-05: 31 via INTRAVENOUS

## 2021-08-05 MED ORDER — TECHNETIUM TC 99M TETROFOSMIN IV KIT
10.0000 | PACK | Freq: Once | INTRAVENOUS | Status: AC | PRN
  Administered 2021-08-05: 10.3 via INTRAVENOUS

## 2021-08-07 ENCOUNTER — Encounter: Payer: Self-pay | Admitting: *Deleted

## 2022-03-05 ENCOUNTER — Encounter: Payer: Self-pay | Admitting: Cardiology

## 2022-03-05 ENCOUNTER — Ambulatory Visit (INDEPENDENT_AMBULATORY_CARE_PROVIDER_SITE_OTHER): Payer: Medicare Other | Admitting: Cardiology

## 2022-03-05 VITALS — BP 130/80 | HR 64 | Ht 67.0 in | Wt 163.0 lb

## 2022-03-05 DIAGNOSIS — I2510 Atherosclerotic heart disease of native coronary artery without angina pectoris: Secondary | ICD-10-CM

## 2022-03-05 DIAGNOSIS — E785 Hyperlipidemia, unspecified: Secondary | ICD-10-CM

## 2022-03-05 DIAGNOSIS — I10 Essential (primary) hypertension: Secondary | ICD-10-CM

## 2022-03-05 NOTE — Patient Instructions (Signed)
Medication Instructions:  The current medical regimen is effective;  continue present plan and medications.  *If you need a refill on your cardiac medications before your next appointment, please call your pharmacy*  Follow-Up: At [facility], you and your health needs are our priority.  As part of our continuing mission to provide you with exceptional heart care, we have created designated Provider Care Teams.  These Care Teams include your primary Cardiologist (physician) and Advanced Practice Providers (APPs -  Physician Assistants and Nurse Practitioners) who all work together to provide you with the care you need, when you need it.  We recommend signing up for the patient portal called "MyChart".  Sign up information is provided on this After Visit Summary.  MyChart is used to connect with patients for Virtual Visits (Telemedicine).  Patients are able to view lab/test results, encounter notes, upcoming appointments, etc.  Non-urgent messages can be sent to your provider as well.   To learn more about what you can do with MyChart, go to [email or web address].    Your next appointment:   1 year(s)  The format for your next appointment:   In Person  Provider:   James Hochrein, MD     Important Information About Sugar       

## 2022-03-05 NOTE — Progress Notes (Signed)
Cardiology Office Note   Date:  03/05/2022   ID:  Bijan, Ridgley 1950-09-14, MRN [id number]  PCP:  Verl Bangs, MD  Cardiologist:   None   Chief Complaint  Patient presents with   Coronary Artery Disease      History of Present Illness: Kyle Finley is a 71 y.o. male who presents for ollowup of CAD,   He had 99% ostial LAD stenosis treated with a drug-eluting stent in 2014. Initially his ejection fraction was about 20% but improved to 55% on followup echo in June of 2014.  He has had difficult to control BP.   I last saw him in Jan 2023.   He was having some shortness of breath but he had a low risk perfusion study.  Since I last saw him he has had no new cardiovascular problems.  His blood pressure seems to be better controlled. The patient denies any new symptoms such as chest discomfort, neck or arm discomfort. There has been no new shortness of breath, PND or orthopnea. There have been no reported palpitations, presyncope or syncope.     Past Medical History:  Diagnosis Date   CAD (coronary artery disease)    Acute anterior STEMI/emergent DES 99% ostial LAD, 11/2012   Ejection fraction    EF 30%, at time of acute MI    //   EF 55%, echo, June, 2014, hypokinesis of the distal anteroseptal wall   HLD (hyperlipidemia)    Atorvastatin intolerant, secondary to angioedema of the lip   HTN (hypertension)    Inguinal hernia    a. s/p repair x 2 on left   Ischemic cardiomyopathy    Initial EF 30%, by cardiac catheterization; followup EF 55%, by echo   Tobacco abuse    a. 1.5 ppd x 40 yrs    Past Surgical History:  Procedure Laterality Date   CORONARY ANGIOPLASTY WITH STENT PLACEMENT     Hernia Repair     a. L inguinal x 2   LEFT HEART CATHETERIZATION WITH CORONARY ANGIOGRAM N/A 11/24/2012   Procedure: LEFT HEART CATHETERIZATION WITH CORONARY ANGIOGRAM;  Surgeon: Kathleene Hazel, MD;  Location: [facility] CATH LAB;  Service: Cardiovascular;  Laterality:  N/A;   PERCUTANEOUS CORONARY STENT INTERVENTION (PCI-S)  11/24/2012   Procedure: PERCUTANEOUS CORONARY STENT INTERVENTION (PCI-S);  Surgeon: Kathleene Hazel, MD;  Location: [facility] CATH LAB;  Service: Cardiovascular;;     Current Outpatient Medications  Medication Sig Dispense Refill   amLODipine (NORVASC) 2.5 MG tablet Take 1 tablet (2.5 mg total) by mouth daily. 90 tablet 3   aspirin EC 81 MG EC tablet Take 1 tablet (81 mg total) by mouth daily. 30 tablet 10   atorvastatin (LIPITOR) 80 MG tablet TAKE ONE TABLET BY MOUTH ONE TIME DAILY     chlorthalidone (HYGROTON) 50 MG tablet Take 50 mg by mouth daily.  1   diphenhydrAMINE (BENADRYL) 25 MG tablet Take 25 mg by mouth as needed.      ezetimibe (ZETIA) 10 MG tablet TAKE ONE TABLET BY MOUTH ONE TIME DAILY     KLOR-CON M20 20 MEQ tablet TAKE ONE TABLET (20 MEQ TOTAL) BY MOUTH DAILY.  1   metoprolol succinate (TOPROL-XL) 100 MG 24 hr tablet Take 100 mg by mouth daily.  0   nitroGLYCERIN (NITROSTAT) 0.4 MG SL tablet Place 1 tablet (0.4 mg total) under the tongue every 5 (five) minutes x 3 doses as needed for chest pain. 30 tablet  12   omeprazole (PRILOSEC OTC) 20 MG tablet Take 20 mg by mouth daily.     No current facility-administered medications for this visit.    Allergies:   Lisinopril    ROS:  Please see the history of present illness.   Otherwise, review of systems are positive for none.   All other systems are reviewed and negative.    PHYSICAL EXAM: VS:  BP 130/80   Pulse 64   Ht 5\' 7"  (1.702 m)   Wt 163 lb (73.9 kg)   BMI 25.53 kg/m  , BMI Body mass index is 25.53 kg/m. GENERAL:  Well appearing NECK:  No jugular venous distention, waveform within normal limits, carotid upstroke brisk and symmetric, no bruits, no thyromegaly LUNGS:  Clear to auscultation bilaterally CHEST:  Unremarkable HEART:  PMI not displaced or sustained,S1 and S2 within normal limits, no S3, no S4, no clicks, no rubs, no murmurs ABD:  Flat, positive  bowel sounds normal in frequency in pitch, no bruits, no rebound, no guarding, no midline pulsatile mass, no hepatomegaly, no splenomegaly EXT:  2 plus pulses throughout, no edema, no cyanosis no clubbing   EKG:  EKG is not ordered today.    Recent Labs: No results found for requested labs within last 365 days.    Lipid Panel    Component Value Date/Time   CHOL 288 (H) 02/18/2013 0845   TRIG 230.0 (H) 02/18/2013 0845   HDL 39.40 02/18/2013 0845   CHOLHDL 7 02/18/2013 0845   VLDL 46.0 (H) 02/18/2013 0845   LDLCALC 128 (H) 11/25/2012 0510   LDLDIRECT 210.1 02/18/2013 0845      Wt Readings from Last 3 Encounters:  03/05/22 163 lb (73.9 kg)  07/10/21 166 lb 12.8 oz (75.7 kg)  06/08/19 169 lb (76.7 kg)      Other studies Reviewed: Additional studies/ records that were reviewed today include: Labs. Review of the above records demonstrates:  Please see elsewhere in the note.     ASSESSMENT AND PLAN:  CAD:   The patient has no new sypmtoms.  No further cardiovascular testing is indicated.  We will continue with aggressive risk reduction and meds as listed.  He has had no new symptoms since his stress test earlier this year which was low risk.  HTN:  The blood pressure is at target.  No change in therapy.   TOBACCO ABUSE:   He has no intention of quitting smoking.    DYSLIPIDEMIA:   LDL was checked earlier this month and was 74 with an HDL of 51.  No change in therapy.    Current medicines are reviewed at length with the patient today.  The patient does not have concerns regarding medicines.  The following changes have been made:  no change  Labs/ tests ordered today include:   No orders of the defined types were placed in this encounter.    Disposition:   FU with me in 12 months or sooner if needed   Signed, 14/02/20, MD  03/05/2022 3:31 PM    [facility]

## 2022-04-09 ENCOUNTER — Ambulatory Visit: Payer: Medicare Other | Admitting: Cardiology

## 2024-01-17 IMAGING — US US CAROTID DUPLEX BILAT
1 series · 13 of 24 positions shown · non-contrast
Comparison: None.

CLINICAL DATA: bruit

EXAM:
BILATERAL CAROTID DUPLEX ULTRASOUND
TECHNIQUE: Gray scale imaging, color Doppler and duplex ultrasound were
performed of bilateral carotid and vertebral arteries in the neck.

[Series 1: us carotid bilateral · 13 of 68 slices shown]
[im 1/68]
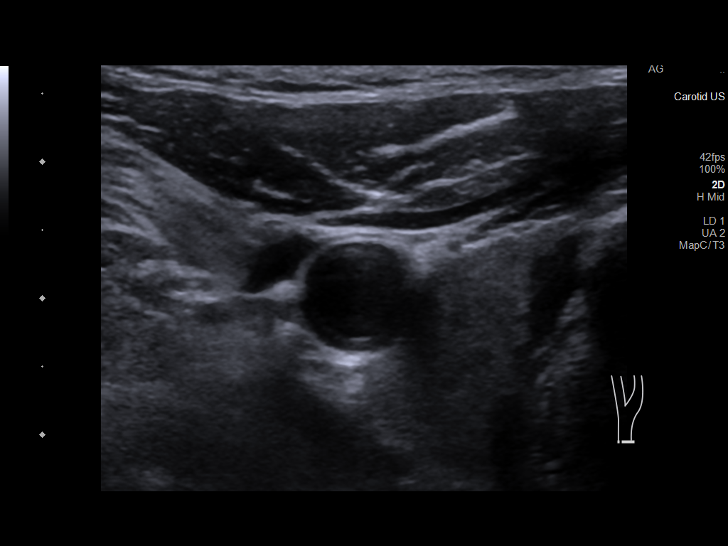
[im 6/68]
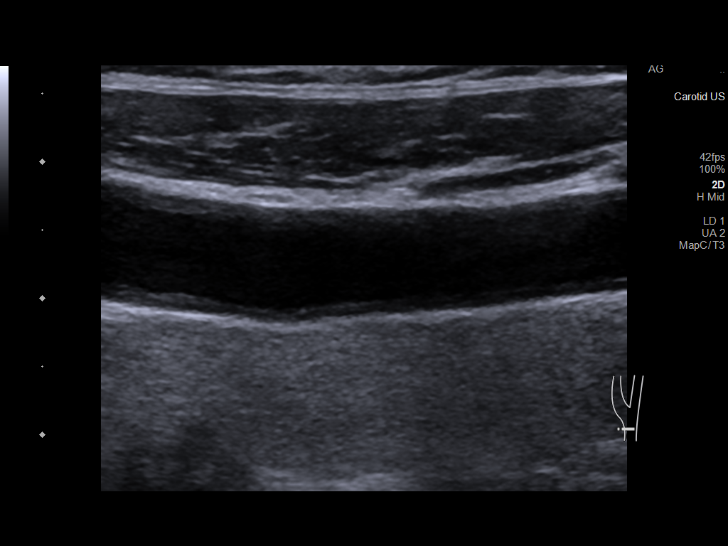
[im 12/68]
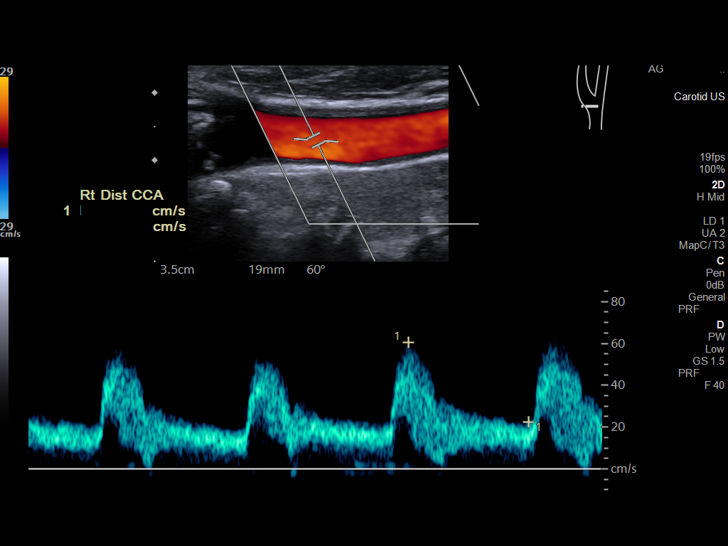
[im 18/68]
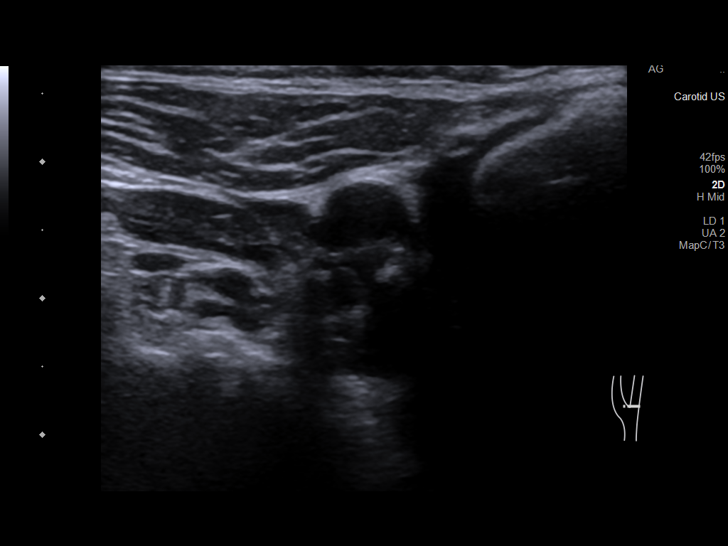
[im 24/68]
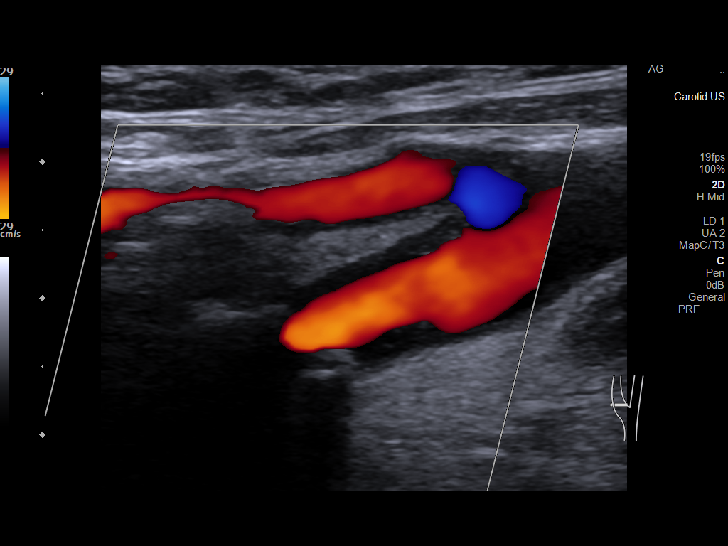
[im 30/68]
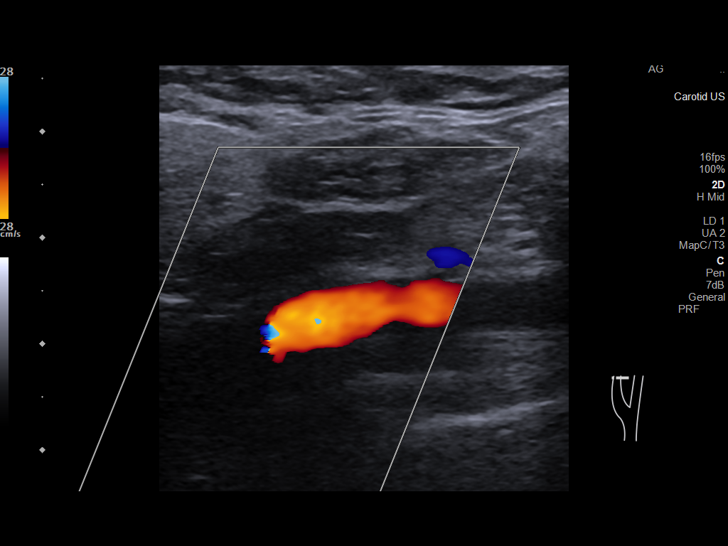
[im 35/68]
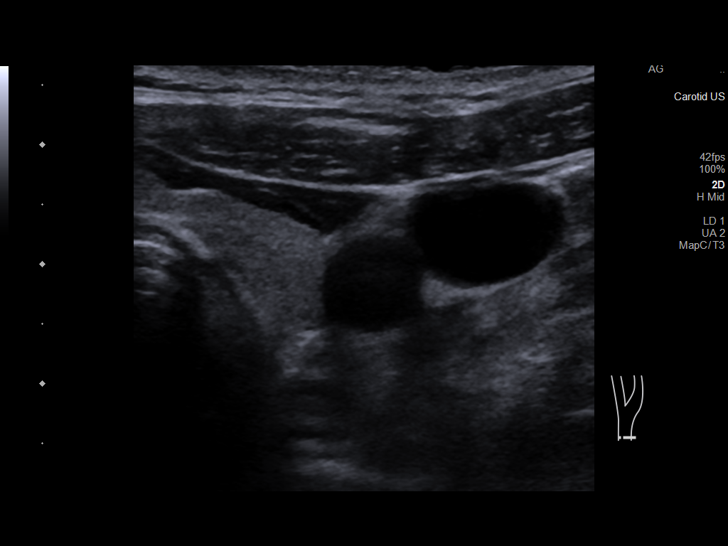
[im 38/68]
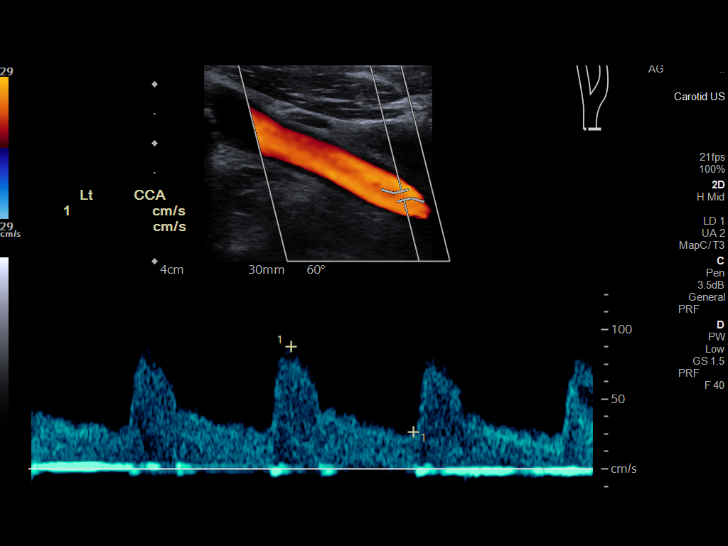
[im 44/68]
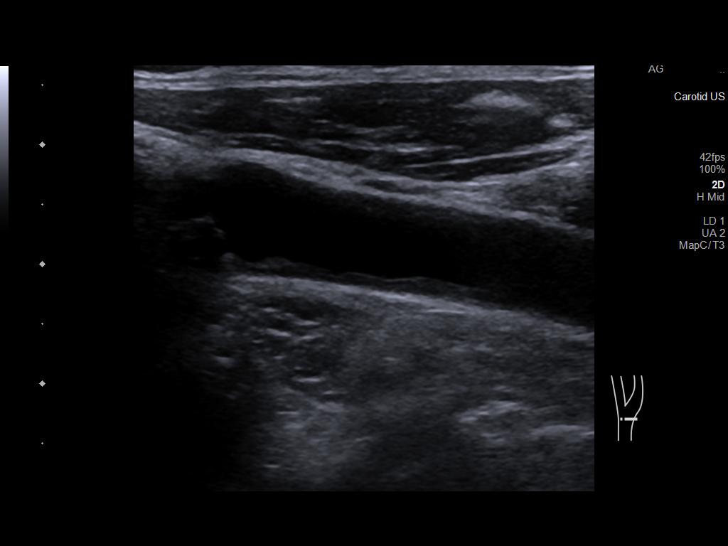
[im 50/68]
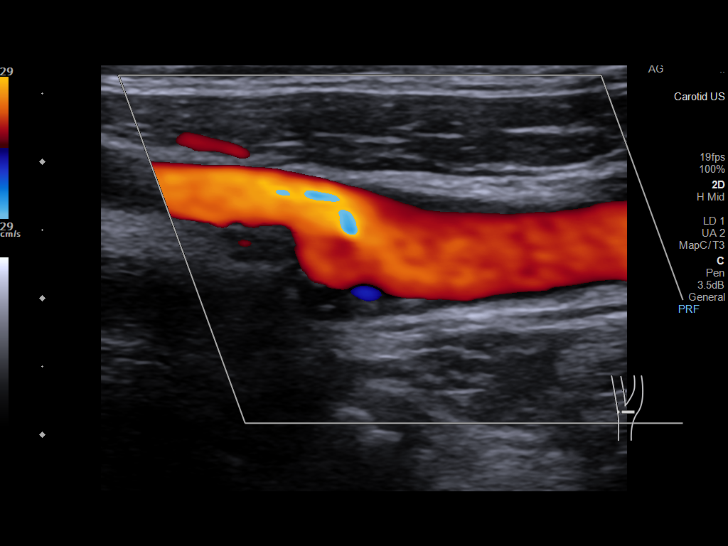
[im 56/68]
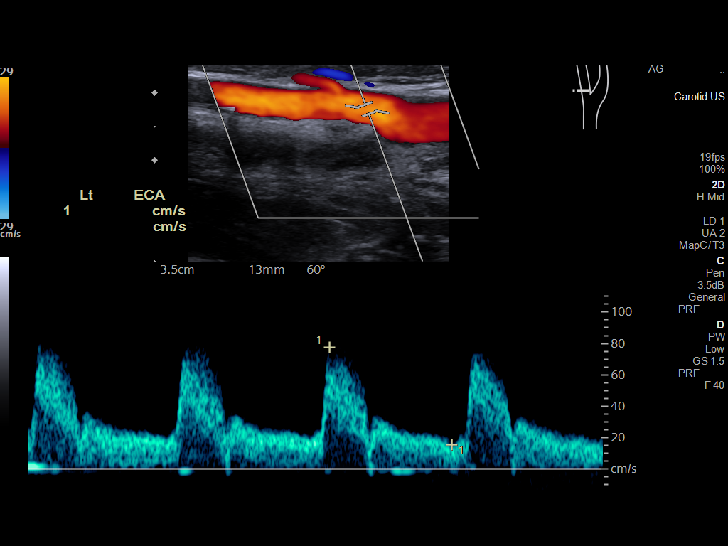
[im 62/68]
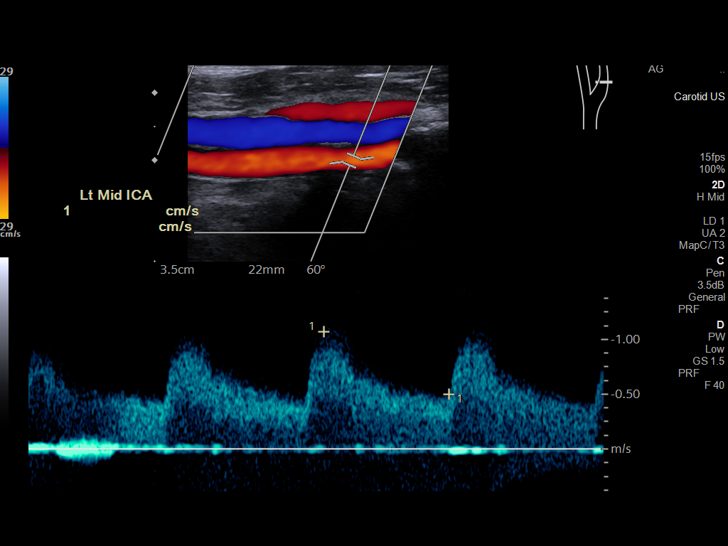
[im 68/68]
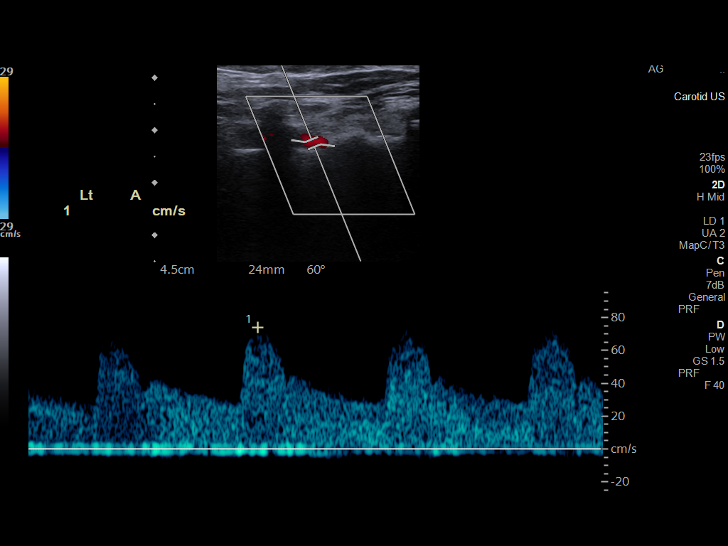

[13 of 24 positions shown; findings below may reference images not displayed]

FINDINGS: Criteria: Quantification of carotid stenosis is based on velocity
parameters that correlate the residual internal carotid diameter
with NASCET-based stenosis levels, using the diameter of the distal
internal carotid lumen as the denominator for stenosis measurement.

The following velocity measurements were obtained:

RIGHT

ICA: 97/42 cm/sec

CCA: 78/24 cm/sec

SYSTOLIC ICA/CCA RATIO:

ECA: 110 cm/sec

LEFT

ICA: 113 x 42 cm/sec

CCA: 71/25 cm/sec

SYSTOLIC ICA/CCA RATIO:

ECA: 78 cm/sec

RIGHT CAROTID ARTERY: Diffuse intimal thickening. No eccentric
calcified plaque or significant stenosis by Doppler criteria. Normal
low resistance waveforms in the internal carotid artery.

RIGHT VERTEBRAL ARTERY:  Antegrade flow

LEFT CAROTID ARTERY: Diffuse intimal thickening. No eccentric
calcified plaque or significant stenosis by Doppler criteria. Normal
low resistance waveforms in the internal carotid artery.

LEFT VERTEBRAL ARTERY:  Antegrade flow
IMPRESSION: No evidence of hemodynamically significant stenosis involving either
the right or left carotid circulation in the neck by Doppler
criteria. Bilateral carotid circulation demonstrates diffuse intimal
thickening without significant calcified plaque and or significant
stenosis.

Bilateral vertebral arteries demonstrate normal antegrade flow.

## 2024-03-13 NOTE — Progress Notes (Deleted)
  Cardiology Office Note:   Date:  03/13/2024  ID:  Kyle, Finley Oct 28, 1950, MRN [id number] PCP: Radiontchenko, Alexei, MD  [facility] Providers Cardiologist:  None {  History of Present Illness:   Kyle Finley is a 73 y.o. male who presents for ollowup of CAD,   He had 99% ostial LAD stenosis treated with a drug-eluting stent in 2014. Initially his ejection fraction was about 20% but improved to 55% on followup echo in June of 2014.  He has had difficult to control BP.   I last saw him in Jan 2023.   He was having some shortness of breath but he had a low risk perfusion study.   Since I last saw him he has had no new cardiovascular problems.  His blood pressure seems to be better controlled. The patient denies any new symptoms such as chest discomfort, neck or arm discomfort. There has been no new shortness of breath, PND or orthopnea. There have been no reported palpitations, presyncope or syncope.     ROS: ***  Studies Reviewed:    EKG:       ***  Risk Assessment/Calculations:   {Does this patient have ATRIAL FIBRILLATION?:[id number]} No BP recorded.  {Refresh Note OR Click here to enter BP  :1}***        Physical Exam:   VS:  There were no vitals taken for this visit.   Wt Readings from Last 3 Encounters:  03/05/22 163 lb (73.9 kg)  07/10/21 166 lb 12.8 oz (75.7 kg)  06/08/19 169 lb (76.7 kg)     GEN: Well nourished, well developed in no acute distress NECK: No JVD; No carotid bruits CARDIAC: ***RR, *** murmurs, rubs, gallops RESPIRATORY:  Clear to auscultation without rales, wheezing or rhonchi  ABDOMEN: Soft, non-tender, non-distended EXTREMITIES:  No edema; No deformity   ASSESSMENT AND PLAN:   CAD:   ***  The patient has no new sypmtoms.  No further cardiovascular testing is indicated.  We will continue with aggressive risk reduction and meds as listed.  He has had no new symptoms since his stress test earlier this year which was low risk.   HTN:   The blood pressure is ***  at target.  No change in therapy.    TOBACCO ABUSE:   *** He has no intention of quitting smoking.    DYSLIPIDEMIA:   LDL was ***  checked earlier this month and was 74 with an HDL of 51.  No change in therapy.        Follow up ***  Signed, Lynwood Schilling, MD

## 2024-03-16 ENCOUNTER — Ambulatory Visit: Admitting: Cardiology

## 2024-03-16 DIAGNOSIS — Z720 Tobacco use: Secondary | ICD-10-CM

## 2024-03-16 DIAGNOSIS — E785 Hyperlipidemia, unspecified: Secondary | ICD-10-CM

## 2024-03-16 DIAGNOSIS — I2510 Atherosclerotic heart disease of native coronary artery without angina pectoris: Secondary | ICD-10-CM

## 2024-03-16 DIAGNOSIS — I10 Essential (primary) hypertension: Secondary | ICD-10-CM
